# Patient Record
Sex: Male | Born: 1955 | Race: White | Hispanic: No | Smoking: Current every day smoker
Health system: Southern US, Community
[De-identification: ages and names within clinical notes are randomized; demographics above are authoritative.]

## PROBLEM LIST (undated history)

## (undated) DIAGNOSIS — I10 Essential (primary) hypertension: Secondary | ICD-10-CM

## (undated) DIAGNOSIS — I714 Abdominal aortic aneurysm, without rupture, unspecified: Secondary | ICD-10-CM

## (undated) DIAGNOSIS — E119 Type 2 diabetes mellitus without complications: Secondary | ICD-10-CM

## (undated) DIAGNOSIS — I2510 Atherosclerotic heart disease of native coronary artery without angina pectoris: Secondary | ICD-10-CM

## (undated) DIAGNOSIS — J439 Emphysema, unspecified: Secondary | ICD-10-CM

## (undated) DIAGNOSIS — Z720 Tobacco use: Secondary | ICD-10-CM

---

## 2006-01-25 ENCOUNTER — Emergency Department: Payer: Self-pay

## 2007-09-02 ENCOUNTER — Emergency Department: Payer: Self-pay | Admitting: Emergency Medicine

## 2007-09-18 ENCOUNTER — Ambulatory Visit: Payer: Self-pay | Admitting: Vascular Surgery

## 2007-12-18 ENCOUNTER — Ambulatory Visit: Payer: Self-pay | Admitting: Vascular Surgery

## 2013-05-25 ENCOUNTER — Ambulatory Visit: Payer: Self-pay | Admitting: Gastroenterology

## 2013-05-30 LAB — PATHOLOGY REPORT

## 2014-08-30 ENCOUNTER — Ambulatory Visit
Admission: RE | Admit: 2014-08-30 | Discharge: 2014-08-30 | Disposition: A | Discharge status: Home / Self Care | Payer: No Typology Code available for payment source | Source: Ambulatory Visit | Attending: Gastroenterology | Admitting: Gastroenterology

## 2014-08-30 ENCOUNTER — Ambulatory Visit: Payer: No Typology Code available for payment source | Admitting: Anesthesiology

## 2014-08-30 ENCOUNTER — Encounter
Admission: RE | Disposition: A | Discharge status: Home / Self Care | Payer: Self-pay | Source: Ambulatory Visit | Attending: Gastroenterology

## 2014-08-30 DIAGNOSIS — E119 Type 2 diabetes mellitus without complications: Secondary | ICD-10-CM | POA: Diagnosis not present

## 2014-08-30 DIAGNOSIS — Z86010 Personal history of colonic polyps: Secondary | ICD-10-CM | POA: Insufficient documentation

## 2014-08-30 DIAGNOSIS — K640 First degree hemorrhoids: Secondary | ICD-10-CM | POA: Diagnosis not present

## 2014-08-30 DIAGNOSIS — D123 Benign neoplasm of transverse colon: Secondary | ICD-10-CM | POA: Diagnosis not present

## 2014-08-30 DIAGNOSIS — K5730 Diverticulosis of large intestine without perforation or abscess without bleeding: Secondary | ICD-10-CM | POA: Insufficient documentation

## 2014-08-30 DIAGNOSIS — D125 Benign neoplasm of sigmoid colon: Secondary | ICD-10-CM | POA: Diagnosis not present

## 2014-08-30 HISTORY — DX: Type 2 diabetes mellitus without complications: E11.9

## 2014-08-30 HISTORY — PX: COLONOSCOPY WITH PROPOFOL: SHX5780

## 2014-08-30 LAB — GLUCOSE, CAPILLARY: Glucose-Capillary: 121 mg/dL — ABNORMAL HIGH (ref 65–99)

## 2014-08-30 SURGERY — COLONOSCOPY WITH PROPOFOL
Anesthesia: General

## 2014-08-30 MED ORDER — SODIUM CHLORIDE 0.9 % IV SOLN
INTRAVENOUS | Status: DC
  Administered 2014-08-30: 09:00:00 via INTRAVENOUS
  Administered 2014-08-30: 1000 mL via INTRAVENOUS

## 2014-08-30 MED ORDER — SODIUM CHLORIDE 0.9 % IV SOLN: INTRAVENOUS | Status: DC

## 2014-08-30 MED ORDER — PROPOFOL 10 MG/ML IV BOLUS
INTRAVENOUS | Status: DC | PRN
  Administered 2014-08-30 (×7): 20 mg via INTRAVENOUS

## 2014-08-30 MED ORDER — MIDAZOLAM HCL 2 MG/2ML IJ SOLN
INTRAMUSCULAR | Status: DC | PRN
  Administered 2014-08-30: 1 mg via INTRAVENOUS

## 2014-08-30 NOTE — H&P (Signed)
  Primary Care Physician:  Juluis Pitch, MD  Pre-Procedure History & Physical: HPI:  Eric Meadows is a 59 y.o. male is here for an colonoscopy.   Past Medical History  Diagnosis Date  . Diabetes mellitus without complication     No past surgical history on file.  Prior to Admission medications   Medication Sig Start Date End Date Taking? Authorizing Provider  metFORMIN (GLUCOPHAGE-XR) 750 MG 24 hr tablet Take 750 mg by mouth daily with breakfast.   Yes Historical Provider, MD  sildenafil (REVATIO) 20 MG tablet Take 20 mg by mouth 3 (three) times daily.   Yes Historical Provider, MD  vardenafil (LEVITRA) 20 MG tablet Take 20 mg by mouth daily as needed for erectile dysfunction.   Yes Historical Provider, MD    Allergies as of 06/06/2014  . (Not on File)    No family history on file.  History   Social History  . Marital Status: Married    Spouse Name: N/A  . Number of Children: N/A  . Years of Education: N/A   Occupational History  . Not on file.   Social History Main Topics  . Smoking status: Not on file  . Smokeless tobacco: Not on file  . Alcohol Use: Not on file  . Drug Use: Not on file  . Sexual Activity: Not on file   Other Topics Concern  . Not on file   Social History Narrative  . No narrative on file     Physical Exam: BP 127/80 mmHg  Pulse 74  Temp(Src) 97.4 F (36.3 C)  Resp 20  Ht 5\' 10"  (1.778 m)  Wt 79.606 kg (175 lb 8 oz)  BMI 25.18 kg/m2  SpO2 95% General:   Alert,  pleasant and cooperative in NAD Head:  Normocephalic and atraumatic. Neck:  Supple; no masses or thyromegaly. Lungs:  Clear throughout to auscultation.    Heart:  Regular rate and rhythm. Abdomen:  Soft, nontender and nondistended. Normal bowel sounds, without guarding, and without rebound.   Neurologic:  Alert and  oriented x4;  grossly normal neurologically.  Impression/Plan: JACKSTON OAXACA is here for an colonoscopy to be performed for polyp surveillance  Risks,  benefits, limitations, and alternatives regarding  colonoscopy have been reviewed with the patient.  Questions have been answered.  All parties agreeable.   Josefine Class, MD  08/30/2014, 8:59 AM

## 2014-08-30 NOTE — Discharge Instructions (Signed)

## 2014-08-30 NOTE — Anesthesia Postprocedure Evaluation (Signed)
  Anesthesia Post-op Note  Patient: Eric Meadows  Procedure(s) Performed: Procedure(s): COLONOSCOPY WITH PROPOFOL (N/A)  Anesthesia type:General  Patient location: PACU  Post pain: Pain level controlled  Post assessment: Post-op Vital signs reviewed, Patient's Cardiovascular Status Stable, Respiratory Function Stable, Patent Airway and No signs of Nausea or vomiting  Post vital signs: Reviewed and stable  Last Vitals:  Filed Vitals:   08/30/14 0817  BP: 127/80  Pulse: 74  Temp: 36.3 C  Resp: 20    Level of consciousness: awake, alert  and patient cooperative  Complications: No apparent anesthesia complications

## 2014-08-30 NOTE — Op Note (Signed)
[facility] Gastroenterology Patient Name: Eric Meadows Procedure Date: 08/30/2014 9:05 AM MRN: [id number] Account #: [id number] Date of Birth: 24-Aug-1955 Admit Type: Outpatient Age: 59 Room: [facility] ENDO ROOM 1 Gender: Male Note Status: Finalized Procedure:         Colonoscopy Indications:       High risk colon cancer surveillance: Personal history of                     adenoma (10 mm or greater in size), Last colonoscopy: 2015 Patient Profile:   This is a 59 year old male. Providers:         Gerrit Heck. Rayann Heman, MD Referring MD:      Youlanda Roys. Lovie Macadamia, MD (Referring MD) Medicines:         Propofol per Anesthesia Complications:     No immediate complications. Procedure:         Pre-Anesthesia Assessment:                    - Prior to the procedure, a History and Physical was                     performed, and patient medications, allergies and                     sensitivities were reviewed. The patient's tolerance of                     previous anesthesia was reviewed.                    After obtaining informed consent, the colonoscope was                     passed under direct vision. Throughout the procedure, the                     patient's blood pressure, pulse, and oxygen saturations                     were monitored continuously. The Olympus CF-Q160AL                     colonoscope (S#. [id number]) was introduced through the anus                     and advanced to the the cecum, identified by appendiceal                     orifice and ileocecal valve. The colonoscopy was performed                     without difficulty. The patient tolerated the procedure                     well. The quality of the bowel preparation was good. Findings:      The perianal exam findings include non-thrombosed external hemorrhoids.      Four sessile polyps were found in the transverse colon. The polyps were       2 to 5 mm in size. These polyps were removed with a cold snare.       Resection and retrieval were complete.      Three sessile polyps were found in the sigmoid colon. The polyps were 2  to 5 mm in size. These polyps were removed with a cold snare. Resection       and retrieval were complete.      Internal hemorrhoids were found during retroflexion. The hemorrhoids       were Grade I (internal hemorrhoids that do not prolapse).      - Tatoo in transvere colon with no residual polyp tissue or scar      The exam was otherwise without abnormality. Impression:        - Non-thrombosed external hemorrhoids found on perianal                     exam.                    - Four 2 to 5 mm polyps in the transverse colon. Resected                     and retrieved.                    - Three 2 to 5 mm polyps in the sigmoid colon. Resected                     and retrieved.                    - Internal hemorrhoids.                    - Tatoo in transvere colon with no residual polyp tissue                     or scar                    - The examination was otherwise normal. Recommendation:    - Observe patient in GI recovery unit.                    - High fiber diet.                    - Continue present medications.                    - Await pathology results.                    - Repeat colonoscopy for surveillance based on pathology                     results, likely in 3 years.                    - Return to referring physician.                    - The findings and recommendations were discussed with the                     patient.                    - The findings and recommendations were discussed with the                     patient's family. Procedure Code(s): --- Professional ---                    [phone],  Colonoscopy, flexible; with removal of tumor(s),                     polyp(s), or other lesion(s) by snare technique CPT copyright 2014 American [employer]. All rights reserved. The codes documented in this report are preliminary  and upon coder review may  be revised to meet current compliance requirements. Mellody Life, MD 08/30/2014 9:43:36 AM This report has been signed electronically. Number of Addenda: 0 Note Initiated On: 08/30/2014 9:05 AM Scope Withdrawal Time: 0 hours 17 minutes 8 seconds  Total Procedure Duration: 0 hours 25 minutes 27 seconds       [facility]

## 2014-08-30 NOTE — Anesthesia Preprocedure Evaluation (Signed)
Anesthesia Evaluation  Patient identified by MRN, date of birth, ID band Patient awake    Reviewed: Allergy & Precautions, H&P , NPO status , Patient's Chart, lab work & pertinent test results, reviewed documented beta blocker date and time   Airway Mallampati: II  TM Distance: >3 FB Neck ROM: full    Dental no notable dental hx.    Pulmonary neg pulmonary ROS,  breath sounds clear to auscultation  Pulmonary exam normal       Cardiovascular Exercise Tolerance: Good negative cardio ROS  Rhythm:regular Rate:Normal     Neuro/Psych negative neurological ROS  negative psych ROS   GI/Hepatic negative GI ROS, Neg liver ROS,   Endo/Other  negative endocrine ROSdiabetes  Renal/GU negative Renal ROS  negative genitourinary   Musculoskeletal   Abdominal   Peds  Hematology negative hematology ROS (+)   Anesthesia Other Findings   Reproductive/Obstetrics negative OB ROS                             Anesthesia Physical Anesthesia Plan  ASA: II  Anesthesia Plan: General   Post-op Pain Management:    Induction:   Airway Management Planned:   Additional Equipment:   Intra-op Plan:   Post-operative Plan:   Informed Consent: I have reviewed the patients History and Physical, chart, labs and discussed the procedure including the risks, benefits and alternatives for the proposed anesthesia with the patient or authorized representative who has indicated his/her understanding and acceptance.   Dental Advisory Given  Plan Discussed with: CRNA  Anesthesia Plan Comments:         Anesthesia Quick Evaluation

## 2014-08-30 NOTE — Transfer of Care (Signed)
Immediate Anesthesia Transfer of Care Note  Patient: Eric Meadows  Procedure(s) Performed: Procedure(s): COLONOSCOPY WITH PROPOFOL (N/A)  Patient Location: PACU and Endoscopy Unit  Anesthesia Type:General  Level of Consciousness: sedated  Airway & Oxygen Therapy: Patient Spontanous Breathing and Patient connected to nasal cannula oxygen  Post-op Assessment: Report given to RN and Post -op Vital signs reviewed and stable  Post vital signs: stable  Last Vitals:  Filed Vitals:   08/30/14 0817  BP: 127/80  Pulse: 74  Temp: 36.3 C  Resp: 20    Complications: No apparent anesthesia complications

## 2014-08-31 ENCOUNTER — Encounter: Payer: Self-pay | Admitting: Gastroenterology

## 2014-09-02 LAB — SURGICAL PATHOLOGY

## 2017-09-13 ENCOUNTER — Emergency Department: Payer: 59

## 2017-09-13 ENCOUNTER — Emergency Department
Admission: EM | Admit: 2017-09-13 | Discharge: 2017-09-13 | Disposition: A | Discharge status: Home / Self Care | Payer: 59 | Attending: Student in an Organized Health Care Education/Training Program | Admitting: Student in an Organized Health Care Education/Training Program

## 2017-09-13 DIAGNOSIS — E119 Type 2 diabetes mellitus without complications: Secondary | ICD-10-CM | POA: Diagnosis not present

## 2017-09-13 DIAGNOSIS — R945 Abnormal results of liver function studies: Secondary | ICD-10-CM

## 2017-09-13 DIAGNOSIS — Z7984 Long term (current) use of oral hypoglycemic drugs: Secondary | ICD-10-CM | POA: Diagnosis not present

## 2017-09-13 DIAGNOSIS — Z79899 Other long term (current) drug therapy: Secondary | ICD-10-CM | POA: Insufficient documentation

## 2017-09-13 DIAGNOSIS — K859 Acute pancreatitis without necrosis or infection, unspecified: Secondary | ICD-10-CM | POA: Diagnosis not present

## 2017-09-13 DIAGNOSIS — R7989 Other specified abnormal findings of blood chemistry: Secondary | ICD-10-CM

## 2017-09-13 LAB — COMPREHENSIVE METABOLIC PANEL
ALBUMIN: 4.3 g/dL (ref 3.5–5.0)
ALK PHOS: 71 U/L (ref 38–126)
ALT: 14 U/L (ref 0–44)
AST: 18 U/L (ref 15–41)
Anion gap: 9 (ref 5–15)
BUN: 22 mg/dL (ref 8–23)
CHLORIDE: 103 mmol/L (ref 98–111)
CO2: 27 mmol/L (ref 22–32)
CREATININE: 1.15 mg/dL (ref 0.61–1.24)
Calcium: 9.3 mg/dL (ref 8.9–10.3)
GFR calc non Af Amer: >60 mL/min (ref >60)
GLUCOSE: 138 mg/dL — AB (ref 70–99)
Potassium: 3.7 mmol/L (ref 3.5–5.1)
SODIUM: 139 mmol/L (ref 135–145)
Total Bilirubin: 1.3 mg/dL — ABNORMAL HIGH (ref 0.3–1.2)
Total Protein: 7.5 g/dL (ref 6.5–8.1)

## 2017-09-13 LAB — URINALYSIS, COMPLETE (UACMP) WITH MICROSCOPIC
Bacteria, UA: NONE SEEN
Glucose, UA: NEGATIVE mg/dL
KETONES UR: 80 mg/dL — AB
Leukocytes, UA: NEGATIVE
Nitrite: NEGATIVE
Protein, ur: 100 mg/dL — AB
Specific Gravity, Urine: 1.034 — ABNORMAL HIGH (ref 1.005–1.030)
pH: 5 (ref 5.0–8.0)

## 2017-09-13 LAB — CBC
HCT: 48.3 % (ref 40.0–52.0)
Hemoglobin: 16.8 g/dL (ref 13.0–18.0)
MCH: 32.1 pg (ref 26.0–34.0)
MCHC: 34.9 g/dL (ref 32.0–36.0)
MCV: 92 fL (ref 80.0–100.0)
Platelets: 273 10*3/uL (ref 150–440)
RBC: 5.25 MIL/uL (ref 4.40–5.90)
RDW: 12.4 % (ref 11.5–14.5)
WBC: 15.7 10*3/uL — ABNORMAL HIGH (ref 3.8–10.6)

## 2017-09-13 LAB — LIPASE, BLOOD: Lipase: 77 U/L — ABNORMAL HIGH (ref 11–51)

## 2017-09-13 MED ORDER — POLYETHYLENE GLYCOL 3350 17 G PO PACK: 17.0000 g | PACK | Freq: Every day | ORAL | 0 refills | Status: AC

## 2017-09-13 MED ORDER — IOPAMIDOL (ISOVUE-300) INJECTION 61%
100.0000 mL | Freq: Once | INTRAVENOUS | Status: AC | PRN
  Administered 2017-09-13: 100 mL via INTRAVENOUS

## 2017-09-13 MED ORDER — SODIUM CHLORIDE 0.9 % IV BOLUS
1000.0000 mL | Freq: Once | INTRAVENOUS | Status: AC
  Administered 2017-09-13: 1000 mL via INTRAVENOUS

## 2017-09-13 MED ORDER — METOCLOPRAMIDE HCL 10 MG PO TABS: 10.0000 mg | ORAL_TABLET | Freq: Four times a day (QID) | ORAL | 0 refills | Status: AC | PRN

## 2017-09-13 MED ORDER — ONDANSETRON HCL 4 MG/2ML IJ SOLN
4.0000 mg | Freq: Once | INTRAMUSCULAR | Status: AC
Start: 2017-09-13 — End: 2017-09-13
  Administered 2017-09-13: 4 mg via INTRAVENOUS
  Filled 2017-09-13: qty 2

## 2017-09-13 MED ORDER — DEXTROSE 5 % AND 0.45 % NACL IV BOLUS
1000.0000 mL | Freq: Once | INTRAVENOUS | Status: AC
  Administered 2017-09-13: 1000 mL via INTRAVENOUS
  Filled 2017-09-13: qty 1000

## 2017-09-13 MED ORDER — MORPHINE SULFATE (PF) 4 MG/ML IV SOLN
4.0000 mg | INTRAVENOUS | Status: DC | PRN
  Administered 2017-09-13: 4 mg via INTRAVENOUS

## 2017-09-13 MED ORDER — MORPHINE SULFATE (PF) 4 MG/ML IV SOLN
4.0000 mg | INTRAVENOUS | Status: DC | PRN
  Filled 2017-09-13: qty 1

## 2017-09-13 MED ORDER — HYDROCODONE-ACETAMINOPHEN 5-325 MG PO TABS: 1.0000 | ORAL_TABLET | ORAL | 0 refills | Status: AC | PRN

## 2017-09-13 NOTE — Discharge Instructions (Addendum)

## 2017-09-13 NOTE — ED Notes (Signed)
Patient denies increased pain or nausea with eating crackers. Ambulatory to restroom with steady gait.

## 2017-09-13 NOTE — ED Notes (Signed)
Patient given ice water per Dr. Quentin Cornwall

## 2017-09-13 NOTE — ED Triage Notes (Signed)
Pt states he has pain in his back and abd, bad 2/10 back 7/10.

## 2017-09-13 NOTE — ED Notes (Signed)
Patient given saltines for PO challenge. Will monitor for nausea and abdominal pain

## 2017-09-13 NOTE — ED Provider Notes (Signed)
[facility] Emergency Department Provider Note    First MD Initiated Contact with Patient 09/13/17 1513     (approximate)  I have reviewed the triage vital signs and the nursing notes.   HISTORY  Chief Complaint Abnormal Lab    HPI Eric Meadows is a 62 y.o. male her diabetes who presents the ER with chief complaint of several days of mid back pain as well as nausea vomiting some diarrhea.  States that similar members at work and also been sick with similar symptoms.  States is been having very dark urine flank pain.  Denies any history of kidney stones but has had kidney infections in the past.  Denies any recent surgeries.  No recent antibiotics.  The pain is mild to moderate in severity.   He is a smoker.   Past Medical History:  Diagnosis Date  . Diabetes mellitus without complication ([place])    No family history on file.  There are no active problems to display for this patient.     Prior to Admission medications   Medication Sig Start Date End Date Taking? Authorizing Provider  HYDROcodone-acetaminophen (NORCO) 5-325 MG tablet Take 1 tablet by mouth every 4 (four) hours as needed for moderate pain. 09/13/17   Merlyn Lot, MD  metFORMIN (GLUCOPHAGE-XR) 750 MG 24 hr tablet Take 750 mg by mouth daily with breakfast.    [provider]  metoCLOPramide (REGLAN) 10 MG tablet Take 1 tablet (10 mg total) by mouth every 6 (six) hours as needed for nausea. 09/13/17 09/13/18  Merlyn Lot, MD  polyethylene glycol [facility] / Floria Raveling) packet Take 17 g by mouth daily. Mix one tablespoon with 8oz of your favorite juice or water every day until you are having soft formed stools. Then start taking once daily if you didn't have a stool the day before. 09/13/17   Merlyn Lot, MD  sildenafil (REVATIO) 20 MG tablet Take 20 mg by mouth 3 (three) times daily.    [provider]  vardenafil (LEVITRA) 20 MG tablet Take 20 mg by mouth daily as  needed for erectile dysfunction.    [provider]    Allergies Patient has no known allergies.    Social History Social History   Tobacco Use  . Smoking status: Not on file  Substance Use Topics  . Alcohol use: Not on file  . Drug use: Not on file    Review of Systems Patient denies headaches, rhinorrhea, blurry vision, numbness, shortness of breath, chest pain, edema, cough, abdominal pain, nausea, vomiting, diarrhea, dysuria, fevers, rashes or hallucinations unless otherwise stated above in HPI. ____________________________________________   PHYSICAL EXAM:  VITAL SIGNS: Vitals:   09/13/17 1830 09/13/17 1942  BP: (!) 172/99 (!) 142/87  Pulse: 99 95  Resp: (!) 28 20  Temp:    SpO2: 93% 99%    Constitutional: Alert and oriented.  Having trouble sitting still in the ER. Eyes: Conjunctivae are normal.  Head: Atraumatic. Nose: No congestion/rhinnorhea. Mouth/Throat: Mucous membranes are moist.   Neck: No stridor. Painless ROM.  Cardiovascular: Normal rate, regular rhythm. Grossly normal heart sounds.  Good peripheral circulation. Respiratory: Normal respiratory effort.  No retractions. Lungs CTAB. Gastrointestinal: Soft and nontender. No distention. No abdominal bruits. bilateral CVA tenderness. Genitourinary: defererd Musculoskeletal: No lower extremity tenderness nor edema.  No joint effusions. Neurologic:  Normal speech and language. No gross focal neurologic deficits are appreciated. No facial droop Skin:  Skin is warm, dry and intact. No rash  noted. Psychiatric: Mood and affect are normal. Speech and behavior are normal.  ____________________________________________   LABS (all labs ordered are listed, but only abnormal results are displayed)  Results for orders placed or performed during the hospital encounter of 09/13/17 (from the past 24 hour(s))  Lipase, blood     Status: Abnormal   Collection Time: 09/13/17  3:15 PM  Result Value Ref Range    Lipase 77 (H) 11 - 51 U/L  Comprehensive metabolic panel     Status: Abnormal   Collection Time: 09/13/17  3:15 PM  Result Value Ref Range   Sodium 139 135 - 145 mmol/L   Potassium 3.7 3.5 - 5.1 mmol/L   Chloride 103 98 - 111 mmol/L   CO2 27 22 - 32 mmol/L   Glucose, Bld 138 (H) 70 - 99 mg/dL   BUN 22 8 - 23 mg/dL   Creatinine, Ser 1.15 0.61 - 1.24 mg/dL   Calcium 9.3 8.9 - 10.3 mg/dL   Total Protein 7.5 6.5 - 8.1 g/dL   Albumin 4.3 3.5 - 5.0 g/dL   AST 18 15 - 41 U/L   ALT 14 0 - 44 U/L   Alkaline Phosphatase 71 38 - 126 U/L   Total Bilirubin 1.3 (H) 0.3 - 1.2 mg/dL   GFR calc non Af Amer >60 >60 mL/min   GFR calc Af Amer >60 >60 mL/min   Anion gap 9 5 - 15  CBC     Status: Abnormal   Collection Time: 09/13/17  3:15 PM  Result Value Ref Range   WBC 15.7 (H) 3.8 - 10.6 K/uL   RBC 5.25 4.40 - 5.90 MIL/uL   Hemoglobin 16.8 13.0 - 18.0 g/dL   HCT 48.3 40.0 - 52.0 %   MCV 92.0 80.0 - 100.0 fL   MCH 32.1 26.0 - 34.0 pg   MCHC 34.9 32.0 - 36.0 g/dL   RDW 12.4 11.5 - 14.5 %   Platelets 273 150 - 440 K/uL  Urinalysis, Complete w Microscopic     Status: Abnormal   Collection Time: 09/13/17  4:40 PM  Result Value Ref Range   Color, Urine AMBER (A) YELLOW   APPearance CLEAR (A) CLEAR   Specific Gravity, Urine 1.034 (H) 1.005 - 1.030   pH 5.0 5.0 - 8.0   Glucose, UA NEGATIVE NEGATIVE mg/dL   Hgb urine dipstick MODERATE (A) NEGATIVE   Bilirubin Urine SMALL (A) NEGATIVE   Ketones, ur 80 (A) NEGATIVE mg/dL   Protein, ur 100 (A) NEGATIVE mg/dL   Nitrite NEGATIVE NEGATIVE   Leukocytes, UA NEGATIVE NEGATIVE   RBC / HPF 11-20 0 - 5 RBC/hpf   WBC, UA 0-5 0 - 5 WBC/hpf   Bacteria, UA NONE SEEN NONE SEEN   Squamous Epithelial / LPF 0-5 0 - 5   Mucus PRESENT    ____________________________________________  EKG My review and personal interpretation at Time: 16:40   Indication: back pain  Rate: 95  Rhythm: sinus Axis: normal Other: normal intervals, no  stemi ____________________________________________  RADIOLOGY  I personally reviewed all radiographic images ordered to evaluate for the above acute complaints and reviewed radiology reports and findings.  These findings were personally discussed with the patient.  Please see medical record for radiology report.  ____________________________________________   PROCEDURES  Procedure(s) performed:  Procedures    Critical Care performed: no ____________________________________________   INITIAL IMPRESSION / ASSESSMENT AND PLAN / ED COURSE  Pertinent labs & imaging results that were available during my care  of the patient were reviewed by me and considered in my medical decision making (see chart for details).   DDX: pancreatitis, dehydration, choleithiasis, cholecystitis, acs, stone, pyelo  Eric Meadows is a 62 y.o. who presents to the ED with symptoms as described above..  Patient is AFVSS in ED. Exam as above. Given current presentation have considered the above differential. The patient will be placed on continuous pulse oximetry and telemetry for monitoring.  Laboratory evaluation will be sent to evaluate for the above complaints.  Patient on pain age and risk factors order CT imaging to evaluate for the above differential.  Provide IV fluids as well as IV pain medication.  CT imaging does show evidence of pancreatitis which is uncomplicated without evidence of infection or necrosis.  Blood work is otherwise reassuring.  Urinalysis does show evidence of ketones and based on his history likely on the downsloping end of this clinical course.  Will p.o. challenge and reassess.  Clinical Course as of Sep 13 2020  Tue Sep 13, 2017  1953 Sinus reassuring.  Patient tolerating p.o.  At this point do believe he stable and appropriate for outpatient follow-up.   [PR]    Clinical Course User Index [PR] Merlyn Lot, MD     As part of my medical decision making, I reviewed the  following data within the [place] notes reviewed and incorporated, Labs reviewed, notes from prior ED visits.  ____________________________________________   FINAL CLINICAL IMPRESSION(S) / ED DIAGNOSES  Final diagnoses:  Elevated LFTs  Acute pancreatitis without infection or necrosis, unspecified pancreatitis type      NEW MEDICATIONS STARTED DURING THIS VISIT:  Discharge Medication List as of 09/13/2017  8:03 PM    START taking these medications   Details  HYDROcodone-acetaminophen (NORCO) 5-325 MG tablet Take 1 tablet by mouth every 4 (four) hours as needed for moderate pain., Starting Tue 09/13/2017, Print    metoCLOPramide (REGLAN) 10 MG tablet Take 1 tablet (10 mg total) by mouth every 6 (six) hours as needed for nausea., Starting Tue 09/13/2017, Until Wed 09/13/2018, Print    polyethylene glycol (MIRALAX / GLYCOLAX) packet Take 17 g by mouth daily. Mix one tablespoon with 8oz of your favorite juice or water every day until you are having soft formed stools. Then start taking once daily if you didn't have a stool the day before., Starting Tue 09/13/2017, Print         Note:  This document was prepared using Dragon voice recognition software and may include unintentional dictation errors.    Merlyn Lot, MD 09/13/17 2022

## 2017-09-13 NOTE — ED Triage Notes (Signed)
Pt presents today from PCP for abnormal labs. Pt states PCP told him that he has an elevated WBC and infection somewhere pt is very anxious in triage.

## 2017-09-14 ENCOUNTER — Telehealth: Payer: Self-pay | Admitting: Gastroenterology

## 2017-09-14 NOTE — Telephone Encounter (Signed)
Left vm for pt to call office and schedule Fu with Dr. Vanga °

## 2017-10-19 ENCOUNTER — Ambulatory Visit: Payer: No Typology Code available for payment source | Admitting: Gastroenterology

## 2017-10-19 ENCOUNTER — Other Ambulatory Visit: Payer: Self-pay

## 2017-10-19 DIAGNOSIS — R112 Nausea with vomiting, unspecified: Secondary | ICD-10-CM

## 2019-01-25 IMAGING — CT CT ABD-PELV W/ CM
2 of 5 series · 16 of 46 positions shown, 18 images · IV contrast (APPLIED)
Comparison: CT, 09/02/2007

CLINICAL DATA: Back pain as well as right and left lower quadrant
abdominal pain for 7 days. Elevated white blood cell count.

EXAM:
CT ABDOMEN AND PELVIS WITH CONTRAST
TECHNIQUE: Multidetector CT imaging of the abdomen and pelvis was performed
using the standard protocol following bolus administration of
intravenous contrast.
CONTRAST:  100mL C7HCI4-UQQ IOPAMIDOL (C7HCI4-UQQ) INJECTION 61%

[Series 2: axial st · axial · 0.75mm/px · z∈[-531,-96]mm · 13 of 97 slices shown, 15 images]
[im 5/97  soft-tissue]
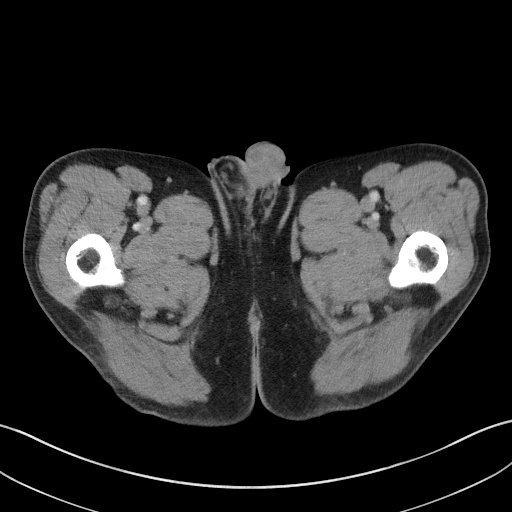
[im 5/97  bone]
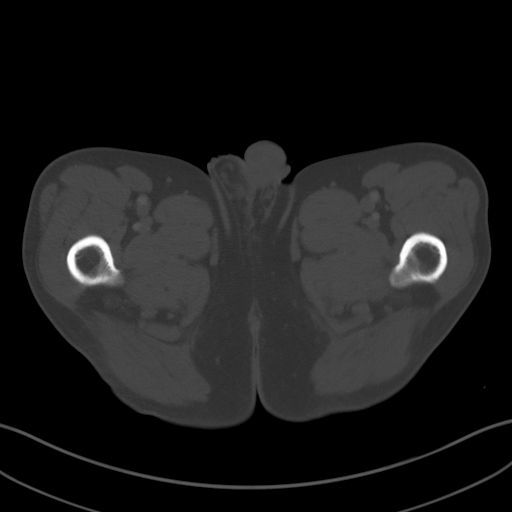
[im 15/97  soft-tissue]
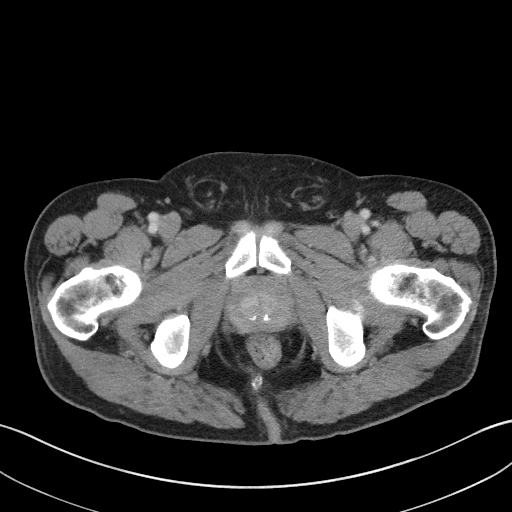
[im 20/97  soft-tissue]
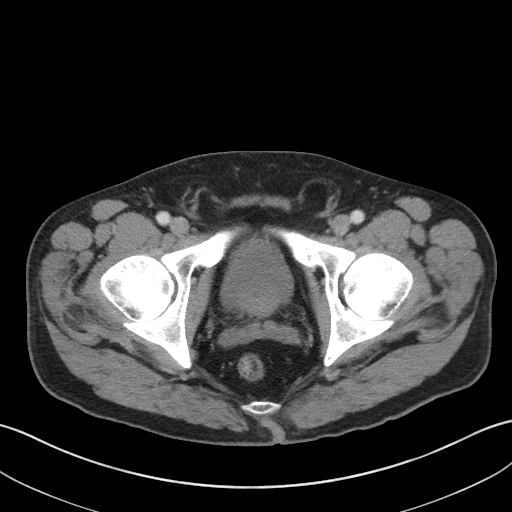
[im 29/97  soft-tissue]
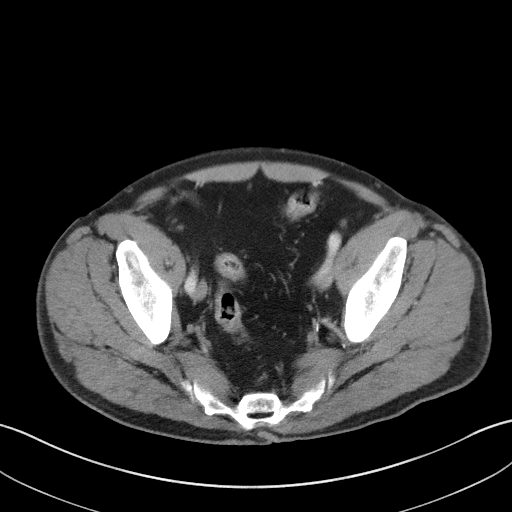
[im 34/97  soft-tissue]
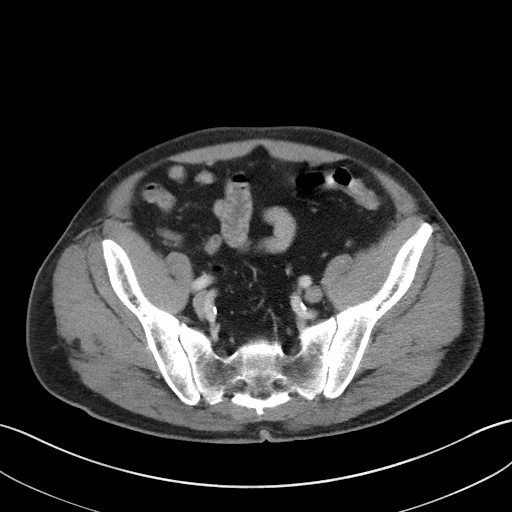
[im 44/97  soft-tissue]
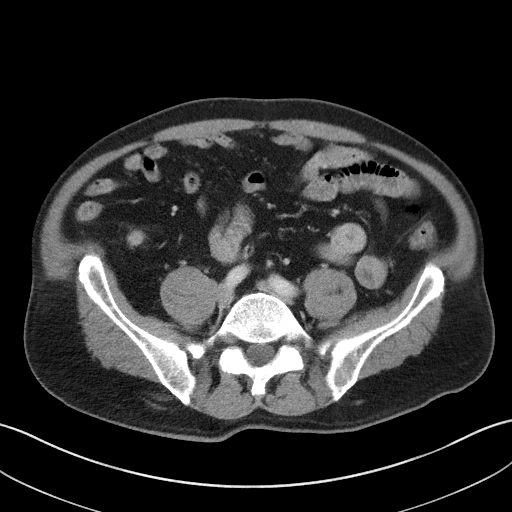
[im 49/97  soft-tissue]
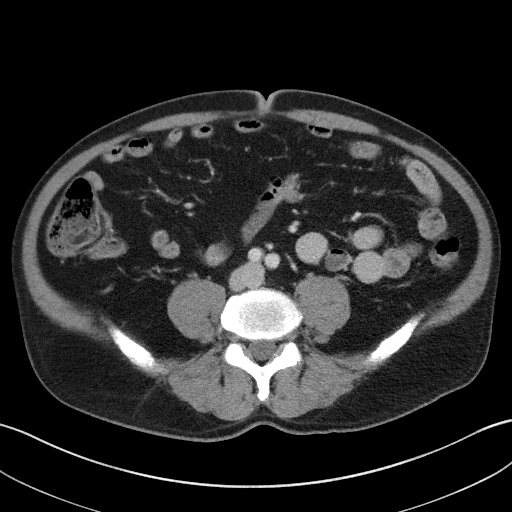
[im 53/97  soft-tissue]
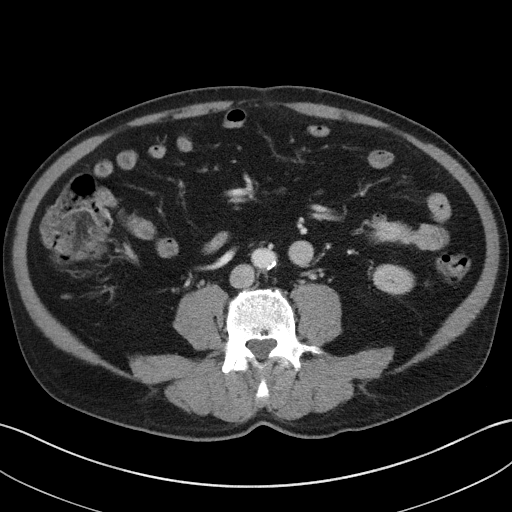
[im 63/97  soft-tissue]
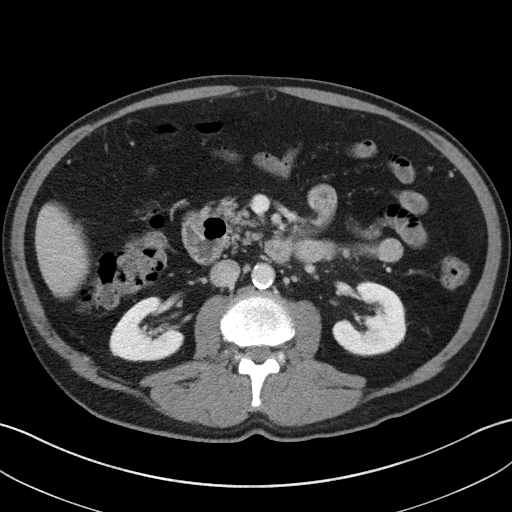
[im 63/97  bone]
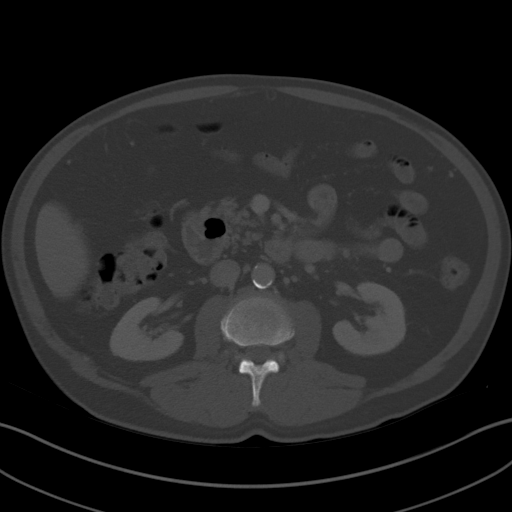
[im 68/97  soft-tissue]
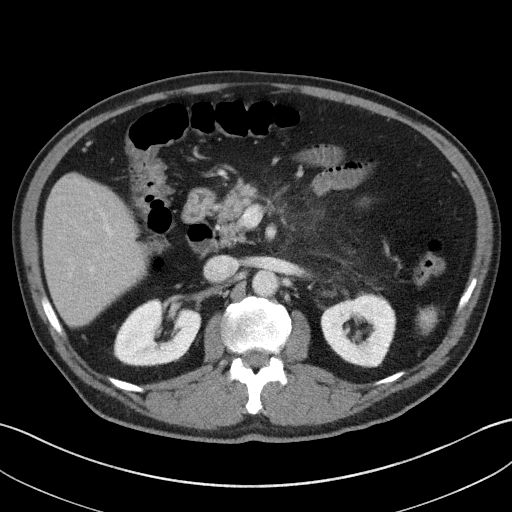
[im 77/97  soft-tissue]
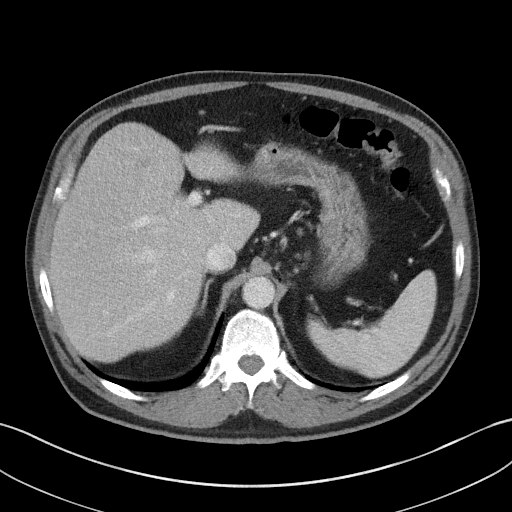
[im 82/97  soft-tissue]
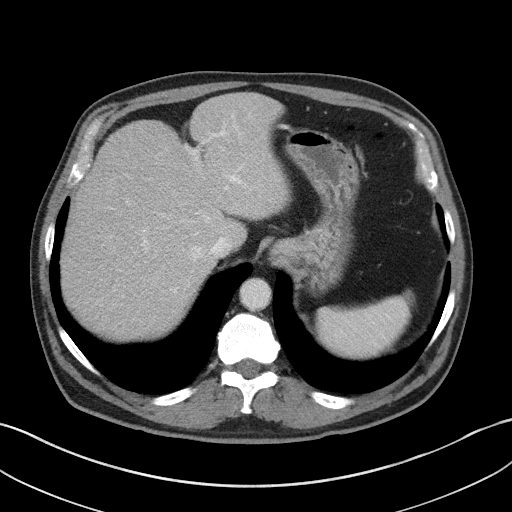
[im 92/97  soft-tissue]
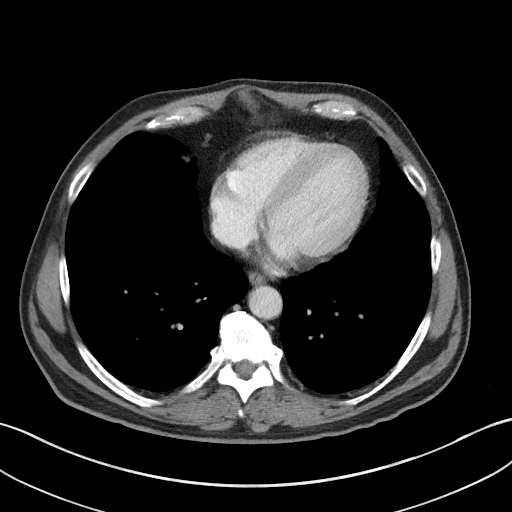

[Series 5: coronal st · coronal · 0.72mm/px · 3 of 90 slices shown]
[im 30/90  soft-tissue]
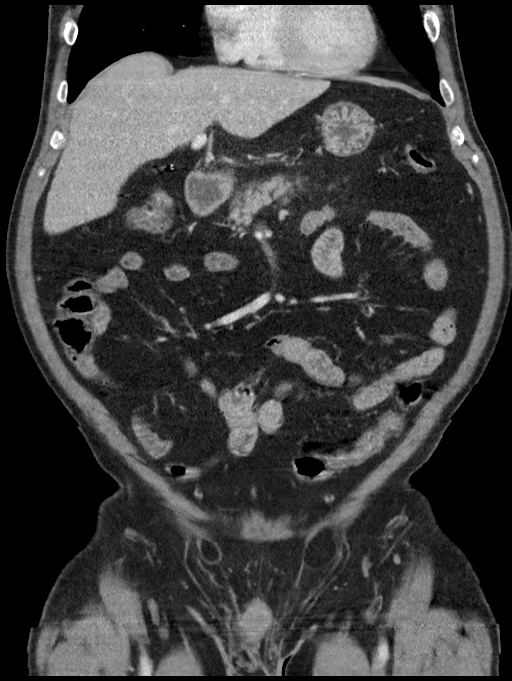
[im 40/90  soft-tissue]
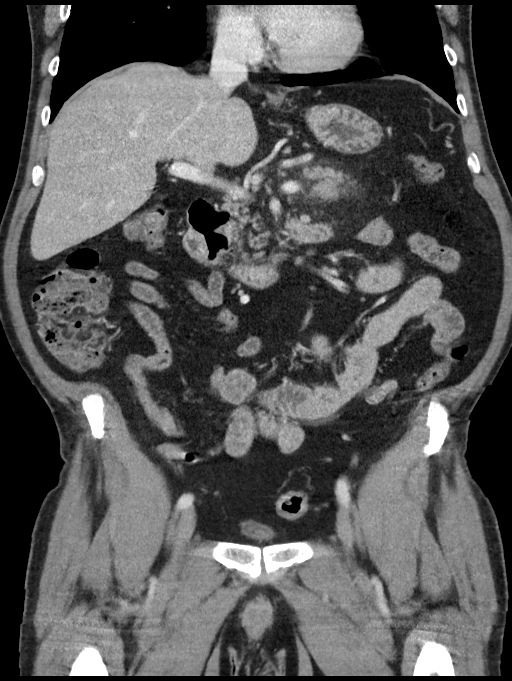
[im 50/90  soft-tissue]
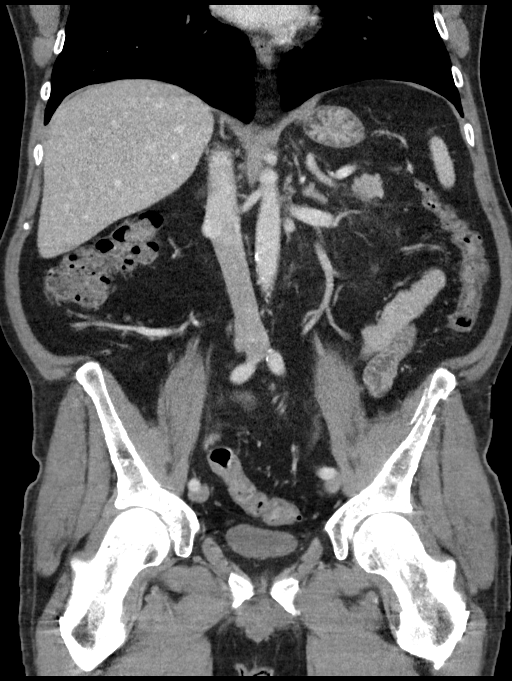

[16 of 46 positions shown; findings below may reference images not displayed]

FINDINGS: Lower chest: Clear lung bases.  The heart is normal in size.

Hepatobiliary: 5 mm low-density lesion at the anterior liver dome,
consistent with a cyst. No other liver masses or lesions. Liver
normal size. Status post cholecystectomy. No bile duct dilation.

Pancreas: There is hazy opacity adjacent to the pancreatic body and
tail consistent with inflammation, not present on the prior CT. No
pancreatic masses. No cysts.

Spleen: Normal in size without focal abnormality.

Adrenals/Urinary Tract: Adrenal glands are unremarkable. Kidneys are
normal, without renal calculi, focal lesion, or hydronephrosis.
Bladder is unremarkable.

Stomach/Bowel: Stomach is within normal limits. Appendix appears
normal. No evidence of bowel wall thickening, distention, or
inflammatory changes.

Vascular/Lymphatic: Aortic atherosclerosis. No enlarged abdominal or
pelvic lymph nodes.

Reproductive: Mildly enlarged prostate measuring 5.2 x 4.0 cm
transversely.

Other: No abdominal wall hernia or abnormality. No abdominopelvic
ascites.

Musculoskeletal: No fracture or acute finding. No osteoblastic or
osteolytic lesions.
IMPRESSION: 1. Mild, uncomplicated acute pancreatitis. No evidence of pancreatic
necrosis or of a pseudocyst. No venous thrombosis.
2. No other acute abnormality within the abdomen or pelvis.
3. Aortic atherosclerosis.

## 2021-04-13 DIAGNOSIS — Z86010 Personal history of colonic polyps: Secondary | ICD-10-CM | POA: Diagnosis not present

## 2021-08-14 DIAGNOSIS — S46112A Strain of muscle, fascia and tendon of long head of biceps, left arm, initial encounter: Secondary | ICD-10-CM | POA: Diagnosis not present

## 2021-08-14 DIAGNOSIS — M25512 Pain in left shoulder: Secondary | ICD-10-CM | POA: Diagnosis not present

## 2021-08-18 DIAGNOSIS — S46112A Strain of muscle, fascia and tendon of long head of biceps, left arm, initial encounter: Secondary | ICD-10-CM | POA: Diagnosis not present

## 2021-08-18 DIAGNOSIS — M25512 Pain in left shoulder: Secondary | ICD-10-CM | POA: Diagnosis not present

## 2021-08-25 DIAGNOSIS — M25512 Pain in left shoulder: Secondary | ICD-10-CM | POA: Diagnosis not present

## 2021-08-25 DIAGNOSIS — S46112A Strain of muscle, fascia and tendon of long head of biceps, left arm, initial encounter: Secondary | ICD-10-CM | POA: Diagnosis not present

## 2021-09-01 ENCOUNTER — Encounter: Payer: Self-pay | Admitting: Internal Medicine

## 2021-09-02 ENCOUNTER — Encounter: Payer: Self-pay | Admitting: Internal Medicine

## 2021-09-02 ENCOUNTER — Ambulatory Visit: Payer: Medicare HMO | Admitting: Certified Registered Nurse Anesthetist

## 2021-09-02 ENCOUNTER — Ambulatory Visit
Admission: RE | Admit: 2021-09-02 | Discharge: 2021-09-02 | Disposition: A | Discharge status: Home / Self Care | Payer: Medicare HMO | Attending: Internal Medicine | Admitting: Internal Medicine

## 2021-09-02 ENCOUNTER — Encounter
Admission: RE | Disposition: A | Discharge status: Home / Self Care | Payer: Self-pay | Source: Home / Self Care | Attending: Internal Medicine

## 2021-09-02 DIAGNOSIS — K5730 Diverticulosis of large intestine without perforation or abscess without bleeding: Secondary | ICD-10-CM | POA: Diagnosis not present

## 2021-09-02 DIAGNOSIS — K640 First degree hemorrhoids: Secondary | ICD-10-CM | POA: Diagnosis not present

## 2021-09-02 DIAGNOSIS — F17200 Nicotine dependence, unspecified, uncomplicated: Secondary | ICD-10-CM | POA: Diagnosis not present

## 2021-09-02 DIAGNOSIS — D124 Benign neoplasm of descending colon: Secondary | ICD-10-CM | POA: Insufficient documentation

## 2021-09-02 DIAGNOSIS — I10 Essential (primary) hypertension: Secondary | ICD-10-CM | POA: Insufficient documentation

## 2021-09-02 DIAGNOSIS — E119 Type 2 diabetes mellitus without complications: Secondary | ICD-10-CM | POA: Insufficient documentation

## 2021-09-02 DIAGNOSIS — K219 Gastro-esophageal reflux disease without esophagitis: Secondary | ICD-10-CM | POA: Insufficient documentation

## 2021-09-02 DIAGNOSIS — D120 Benign neoplasm of cecum: Secondary | ICD-10-CM | POA: Diagnosis not present

## 2021-09-02 DIAGNOSIS — Z86010 Personal history of colonic polyps: Secondary | ICD-10-CM | POA: Diagnosis not present

## 2021-09-02 DIAGNOSIS — N400 Enlarged prostate without lower urinary tract symptoms: Secondary | ICD-10-CM | POA: Diagnosis not present

## 2021-09-02 DIAGNOSIS — R69 Illness, unspecified: Secondary | ICD-10-CM | POA: Diagnosis not present

## 2021-09-02 DIAGNOSIS — J449 Chronic obstructive pulmonary disease, unspecified: Secondary | ICD-10-CM | POA: Diagnosis not present

## 2021-09-02 DIAGNOSIS — K635 Polyp of colon: Secondary | ICD-10-CM | POA: Diagnosis not present

## 2021-09-02 DIAGNOSIS — K648 Other hemorrhoids: Secondary | ICD-10-CM | POA: Diagnosis not present

## 2021-09-02 DIAGNOSIS — Z1211 Encounter for screening for malignant neoplasm of colon: Secondary | ICD-10-CM | POA: Insufficient documentation

## 2021-09-02 HISTORY — PX: COLONOSCOPY WITH PROPOFOL: SHX5780

## 2021-09-02 HISTORY — DX: Essential (primary) hypertension: I10

## 2021-09-02 LAB — GLUCOSE, CAPILLARY: Glucose-Capillary: 179 mg/dL — ABNORMAL HIGH (ref 70–99)

## 2021-09-02 SURGERY — COLONOSCOPY WITH PROPOFOL
Anesthesia: General

## 2021-09-02 MED ORDER — EPHEDRINE SULFATE (PRESSORS) 50 MG/ML IJ SOLN
INTRAMUSCULAR | Status: DC | PRN
  Administered 2021-09-02: 5 mg via INTRAVENOUS

## 2021-09-02 MED ORDER — PHENYLEPHRINE 80 MCG/ML (10ML) SYRINGE FOR IV PUSH (FOR BLOOD PRESSURE SUPPORT)
PREFILLED_SYRINGE | INTRAVENOUS | Status: DC | PRN
  Administered 2021-09-02 (×3): 80 ug via INTRAVENOUS

## 2021-09-02 MED ORDER — PROPOFOL 10 MG/ML IV BOLUS
INTRAVENOUS | Status: DC | PRN
  Administered 2021-09-02: 70 mg via INTRAVENOUS
  Administered 2021-09-02: 20 mg via INTRAVENOUS
  Administered 2021-09-02: 150 ug/kg/min via INTRAVENOUS

## 2021-09-02 MED ORDER — LIDOCAINE HCL (PF) 2 % IJ SOLN
INTRAMUSCULAR | Status: AC
  Filled 2021-09-02: qty 5

## 2021-09-02 MED ORDER — ONDANSETRON HCL 4 MG/2ML IJ SOLN
INTRAMUSCULAR | Status: AC
  Filled 2021-09-02: qty 2

## 2021-09-02 MED ORDER — SODIUM CHLORIDE 0.9 % IV SOLN
INTRAVENOUS | Status: DC
  Administered 2021-09-02: 1000 mL via INTRAVENOUS

## 2021-09-02 MED ORDER — LIDOCAINE HCL (CARDIAC) PF 100 MG/5ML IV SOSY
PREFILLED_SYRINGE | INTRAVENOUS | Status: DC | PRN
  Administered 2021-09-02: 50 mg via INTRAVENOUS

## 2021-09-02 MED ORDER — STERILE WATER FOR IRRIGATION IR SOLN
Status: DC | PRN
  Administered 2021-09-02: 60 mL

## 2021-09-02 MED ORDER — EPHEDRINE 5 MG/ML INJ
INTRAVENOUS | Status: AC
  Filled 2021-09-02: qty 5

## 2021-09-02 MED ORDER — PHENYLEPHRINE 80 MCG/ML (10ML) SYRINGE FOR IV PUSH (FOR BLOOD PRESSURE SUPPORT)
PREFILLED_SYRINGE | INTRAVENOUS | Status: AC
  Filled 2021-09-02: qty 10

## 2021-09-02 MED ORDER — PROPOFOL 1000 MG/100ML IV EMUL
INTRAVENOUS | Status: AC
  Filled 2021-09-02: qty 100

## 2021-09-02 NOTE — Interval H&P Note (Signed)
History and Physical Interval Note:  09/02/2021 8:20 AM  Eric Meadows  has presented today for surgery, with the diagnosis of HX OF COLON POLYPS.  The various methods of treatment have been discussed with the patient and family. After consideration of risks, benefits and other options for treatment, the patient has consented to  Procedure(s): COLONOSCOPY WITH PROPOFOL (N/A) as a surgical intervention.  The patient's history has been reviewed, patient examined, no change in status, stable for surgery.  I have reviewed the patient's chart and labs.  Questions were answered to the patient's satisfaction.     [place], [place]

## 2021-09-02 NOTE — Anesthesia Procedure Notes (Signed)
Procedure Name: MAC Date/Time: 09/02/2021 8:29 AM  Performed by: Tollie Eth, CRNAPre-anesthesia Checklist: Patient identified, Emergency Drugs available, Suction available and Patient being monitored Patient Re-evaluated:Patient Re-evaluated prior to induction Oxygen Delivery Method: Nasal cannula Induction Type: IV induction Placement Confirmation: positive ETCO2

## 2021-09-02 NOTE — Transfer of Care (Signed)
Immediate Anesthesia Transfer of Care Note  Patient: Eric Meadows  Procedure(s) Performed: COLONOSCOPY WITH PROPOFOL  Patient Location: Endoscopy Unit  Anesthesia Type:General  Level of Consciousness: drowsy  Airway & Oxygen Therapy: Patient Spontanous Breathing  Post-op Assessment: Report given to RN and Post -op Vital signs reviewed and stable  Post vital signs: Reviewed and stable  Last Vitals:  Vitals Value Taken Time  BP 94/67 09/02/21 0910  Temp    Pulse 88 09/02/21 0910  Resp 13 09/02/21 0910  SpO2 97 % 09/02/21 0910  Vitals shown include unvalidated device data.  Last Pain:  Vitals:   09/02/21 0753  TempSrc: Temporal  PainSc: 0-No pain         Complications: No notable events documented.

## 2021-09-02 NOTE — Anesthesia Preprocedure Evaluation (Signed)
Anesthesia Evaluation  Patient identified by MRN, date of birth, ID band Patient awake    Reviewed: Allergy & Precautions, NPO status , Patient's Chart, lab work & pertinent test results  History of Anesthesia Complications Negative for: history of anesthetic complications  Airway Mallampati: III  TM Distance: >3 FB Neck ROM: full    Dental  (+) Chipped, Poor Dentition   Pulmonary neg shortness of breath, COPD, Current Smoker,    Pulmonary exam normal        Cardiovascular Exercise Tolerance: Good hypertension, (-) anginaNormal cardiovascular exam     Neuro/Psych negative neurological ROS  negative psych ROS   GI/Hepatic negative GI ROS, Neg liver ROS, neg GERD  ,  Endo/Other  diabetes, Type 2  Renal/GU negative Renal ROS  negative genitourinary   Musculoskeletal   Abdominal   Peds  Hematology negative hematology ROS (+)   Anesthesia Other Findings Past Medical History: No date: Diabetes mellitus without complication (HCC) No date: Hypertension  Past Surgical History: 08/30/2014: COLONOSCOPY WITH PROPOFOL; N/A     Comment:  Procedure: COLONOSCOPY WITH PROPOFOL;  Surgeon: Josefine Class, MD;  Location: [facility] ENDOSCOPY;  Service:               Endoscopy;  Laterality: N/A;  BMI    Body Mass Index: 22.68 kg/m      Reproductive/Obstetrics negative OB ROS                             Anesthesia Physical Anesthesia Plan  ASA: 3  Anesthesia Plan: General   Post-op Pain Management:    Induction: Intravenous  PONV Risk Score and Plan: Propofol infusion and TIVA  Airway Management Planned: Natural Airway and Nasal Cannula  Additional Equipment:   Intra-op Plan:   Post-operative Plan:   Informed Consent: I have reviewed the patients History and Physical, chart, labs and discussed the procedure including the risks, benefits and alternatives for the proposed  anesthesia with the patient or authorized representative who has indicated his/her understanding and acceptance.     Dental Advisory Given  Plan Discussed with: Anesthesiologist, CRNA and Surgeon  Anesthesia Plan Comments: (Patient consented for risks of anesthesia including but not limited to:  - adverse reactions to medications - risk of airway placement if required - damage to eyes, teeth, lips or other oral mucosa - nerve damage due to positioning  - sore throat or hoarseness - Damage to heart, brain, nerves, lungs, other parts of body or loss of life  Patient voiced understanding.)        Anesthesia Quick Evaluation

## 2021-09-02 NOTE — Op Note (Signed)
[facility] Gastroenterology Patient Name: Eric Meadows Procedure Date: 09/02/2021 7:43 AM MRN: [id number] Account #: [id number] Date of Birth: 19-May-1955 Admit Type: Outpatient Age: 66 Room: [facility] ENDO ROOM 2 Gender: Male Note Status: Finalized Instrument Name: Jasper Riling [id number] Procedure:             Colonoscopy Indications:           High risk colon cancer surveillance: Personal history                         of multiple (3 or more) adenomas Providers:             Benay Pike. Alice Reichert MD, MD Referring MD:          Benay Pike. Alice Reichert MD, MD (Referring MD), No Local Md,                         MD (Referring MD) Medicines:             Propofol per Anesthesia Complications:         No immediate complications. Procedure:             Pre-Anesthesia Assessment:                        - The risks and benefits of the procedure and the                         sedation options and risks were discussed with the                         patient. All questions were answered and informed                         consent was obtained.                        - Patient identification and proposed procedure were                         verified prior to the procedure by the nurse. The                         procedure was verified in the procedure room.                        - ASA Grade Assessment: III - A patient with severe                         systemic disease.                        - After reviewing the risks and benefits, the patient                         was deemed in satisfactory condition to undergo the                         procedure.  After obtaining informed consent, the colonoscope was                         passed under direct vision. Throughout the procedure,                         the patient's blood pressure, pulse, and oxygen                         saturations were monitored continuously. The                         Colonoscope was  introduced through the anus and                         advanced to the the cecum, identified by appendiceal                         orifice and ileocecal valve. The colonoscopy was                         performed without difficulty. The patient tolerated                         the procedure well. The quality of the bowel                         preparation was adequate. The ileocecal valve,                         appendiceal orifice, and rectum were photographed. Findings:      The perianal examination was normal.      The digital rectal exam findings include enlarged prostate. Pertinent       negatives include normal sphincter tone.      A 11 mm polyp was found in the descending colon. The polyp was sessile.       The polyp was removed with a hot snare. Resection and retrieval were       complete.      A 6 mm polyp was found in the ileocecal valve. The polyp was sessile.       The polyp was removed with a jumbo cold forceps. Resection and retrieval       were complete. To prevent bleeding after the polypectomy, one hemostatic       clip was successfully placed (MR conditional). There was no bleeding at       the end of the procedure.      A 20 mm polyp was found in the ileocecal valve. The polyp was       carpet-like. The polyp was removed with a piecemeal technique using a       hot snare. Resection and retrieval were complete. Area was tattooed with       an injection of 2 mL of Spot (carbon black).      Three sessile polyps were found in the cecum. The polyps were 6 to 10 mm       in size. These polyps were removed with a hot snare. Resection and       retrieval were complete.      Non-bleeding internal hemorrhoids were found  during retroflexion. The       hemorrhoids were Grade I (internal hemorrhoids that do not prolapse).      A single small-mouthed diverticulum was found in the sigmoid colon.      The exam was otherwise without abnormality. Impression:            - Enlarged  prostate found on digital rectal exam.                        - One 11 mm polyp in the descending colon, removed                         with a hot snare. Resected and retrieved.                        - One 6 mm polyp at the ileocecal valve, removed with                         a jumbo cold forceps. Resected and retrieved. Clip (MR                         conditional) was placed.                        - One 20 mm polyp at the ileocecal valve, removed                         piecemeal using a hot snare. Resected and retrieved.                         Tattooed.                        - Three 6 to 10 mm polyps in the cecum, removed with a                         hot snare. Resected and retrieved.                        - Non-bleeding internal hemorrhoids.                        - Diverticulosis in the sigmoid colon.                        - The examination was otherwise normal. Recommendation:        - Patient has a contact number available for                         emergencies. The signs and symptoms of potential                         delayed complications were discussed with the patient.                         Return to normal activities tomorrow. Written                         discharge instructions were provided  to the patient.                        - Resume previous diet.                        - Continue present medications.                        - Repeat colonoscopy is recommended for surveillance.                         The colonoscopy date will be determined after                         pathology results from today's exam become available                         for review.                        - Return to GI office PRN.                        - The findings and recommendations were discussed with                         the patient. Procedure Code(s):     --- Professional ---                        [phone], Colonoscopy, flexible; with removal of                          tumor(s), polyp(s), or other lesion(s) by snare                         technique                        45380, 76, Colonoscopy, flexible; with biopsy, single                         or multiple                        45381, Colonoscopy, flexible; with directed submucosal                         injection(s), any substance Diagnosis Code(s):     --- Professional ---                        K57.30, Diverticulosis of large intestine without                         perforation or abscess without bleeding                        N40.0, Benign prostatic hyperplasia without lower                         urinary tract symptoms  K64.0, First degree hemorrhoids                        Z86.010, Personal history of colonic polyps                        K63.5, Polyp of colon CPT copyright 2019 American Medical Association. All rights reserved. The codes documented in this report are preliminary and upon coder review may  be revised to meet current compliance requirements. Efrain Sella MD, MD 09/02/2021 9:12:53 AM This report has been signed electronically. Number of Addenda: 0 Note Initiated On: 09/02/2021 7:43 AM Scope Withdrawal Time: 0 hours 24 minutes 42 seconds  Total Procedure Duration: 0 hours 29 minutes 23 seconds  Estimated Blood Loss:  Estimated blood loss was minimal. Estimated blood loss                         was minimal. Estimated blood loss was minimal.      [facility]

## 2021-09-02 NOTE — Anesthesia Postprocedure Evaluation (Signed)
Anesthesia Post Note  Patient: Eric Meadows  Procedure(s) Performed: COLONOSCOPY WITH PROPOFOL  Patient location during evaluation: Endoscopy Anesthesia Type: General Level of consciousness: awake and alert Pain management: pain level controlled Vital Signs Assessment: post-procedure vital signs reviewed and stable Respiratory status: spontaneous breathing, nonlabored ventilation, respiratory function stable and patient connected to nasal cannula oxygen Cardiovascular status: blood pressure returned to baseline and stable Postop Assessment: no apparent nausea or vomiting Anesthetic complications: no   No notable events documented.   Last Vitals:  Vitals:   09/02/21 0920 09/02/21 0930  BP: 105/74 122/80  Pulse: 79 84  Resp: 16 19  Temp:    SpO2: 96% 99%    Last Pain:  Vitals:   09/02/21 0930  TempSrc:   PainSc: 0-No pain                 Precious Haws Kyasia Steuck

## 2021-09-02 NOTE — H&P (Signed)
Outpatient short stay form Pre-procedure 09/02/2021 8:20 AM Eric Meadows K. Eric Meadows, M.D.  Primary Physician: Hendricks Milo, FNP  Reason for visit:  Personal history of adenomatous colon polyps (Colonoscopy 11/11/17).  History of present illness:                            Patient presents for colonoscopy for a personal hx of colon polyps. The patient denies abdominal pain, abnormal weight loss or rectal bleeding.      Current Facility-Administered Medications:    0.9 %  sodium chloride infusion, , Intravenous, Continuous, Eric Meadows, Eric Pike, MD, Last Rate: 20 mL/hr at 09/02/21 0809, 1,000 mL at 09/02/21 0809  Medications Prior to Admission  Medication Sig Dispense Refill Last Dose   HYDROcodone-acetaminophen (NORCO) 5-325 MG tablet Take 1 tablet by mouth every 4 (four) hours as needed for moderate pain. 6 tablet 0 09/01/2021   metFORMIN (GLUCOPHAGE-XR) 750 MG 24 hr tablet Take 750 mg by mouth daily with breakfast.   09/01/2021   polyethylene glycol (MIRALAX / GLYCOLAX) packet Take 17 g by mouth daily. Mix one tablespoon with 8oz of your favorite juice or water every day until you are having soft formed stools. Then start taking once daily if you didn't have a stool the day before. 30 each 0 09/01/2021   metoCLOPramide (REGLAN) 10 MG tablet Take 1 tablet (10 mg total) by mouth every 6 (six) hours as needed for nausea. 12 tablet 0    ondansetron (ZOFRAN-ODT) 4 MG disintegrating tablet     at prn   sildenafil (REVATIO) 20 MG tablet Take 20 mg by mouth 3 (three) times daily.    at prn   vardenafil (LEVITRA) 20 MG tablet Take 20 mg by mouth daily as needed for erectile dysfunction.    at prn     No Known Allergies   Past Medical History:  Diagnosis Date   Diabetes mellitus without complication ([place])    Hypertension     Review of systems:  Otherwise negative.    Physical Exam  Gen: Alert, oriented. Appears stated age.  HEENT: [place]/AT. PERRLA. Lungs: CTA, no wheezes. CV: RR nl S1, S2. Abd:  soft, benign, no masses. BS+ Ext: No edema. Pulses 2+    Planned procedures: Proceed with colonoscopy. The patient understands the nature of the planned procedure, indications, risks, alternatives and potential complications including but not limited to bleeding, infection, perforation, damage to internal organs and possible oversedation/side effects from anesthesia. The patient agrees and gives consent to proceed.  Please refer to procedure notes for findings, recommendations and patient disposition/instructions.     Eric Meadows K. Eric Meadows, M.D. Gastroenterology 09/02/2021  8:20 AM

## 2021-09-03 ENCOUNTER — Encounter: Payer: Self-pay | Admitting: Internal Medicine

## 2021-09-03 DIAGNOSIS — Z1389 Encounter for screening for other disorder: Secondary | ICD-10-CM | POA: Diagnosis not present

## 2021-09-03 DIAGNOSIS — Z0130 Encounter for examination of blood pressure without abnormal findings: Secondary | ICD-10-CM | POA: Diagnosis not present

## 2021-09-03 DIAGNOSIS — Z712 Person consulting for explanation of examination or test findings: Secondary | ICD-10-CM | POA: Diagnosis not present

## 2021-09-03 DIAGNOSIS — S46112A Strain of muscle, fascia and tendon of long head of biceps, left arm, initial encounter: Secondary | ICD-10-CM | POA: Diagnosis not present

## 2021-09-03 DIAGNOSIS — Z23 Encounter for immunization: Secondary | ICD-10-CM | POA: Diagnosis not present

## 2021-09-03 DIAGNOSIS — M25512 Pain in left shoulder: Secondary | ICD-10-CM | POA: Diagnosis not present

## 2021-09-03 DIAGNOSIS — I10 Essential (primary) hypertension: Secondary | ICD-10-CM | POA: Diagnosis not present

## 2021-09-03 DIAGNOSIS — E119 Type 2 diabetes mellitus without complications: Secondary | ICD-10-CM | POA: Diagnosis not present

## 2021-09-03 LAB — SURGICAL PATHOLOGY

## 2021-09-10 DIAGNOSIS — M25512 Pain in left shoulder: Secondary | ICD-10-CM | POA: Diagnosis not present

## 2021-09-10 DIAGNOSIS — S46112A Strain of muscle, fascia and tendon of long head of biceps, left arm, initial encounter: Secondary | ICD-10-CM | POA: Diagnosis not present

## 2021-09-17 DIAGNOSIS — M25512 Pain in left shoulder: Secondary | ICD-10-CM | POA: Diagnosis not present

## 2021-09-17 DIAGNOSIS — S46112A Strain of muscle, fascia and tendon of long head of biceps, left arm, initial encounter: Secondary | ICD-10-CM | POA: Diagnosis not present

## 2021-09-23 DIAGNOSIS — M25512 Pain in left shoulder: Secondary | ICD-10-CM | POA: Diagnosis not present

## 2021-09-23 DIAGNOSIS — S46112A Strain of muscle, fascia and tendon of long head of biceps, left arm, initial encounter: Secondary | ICD-10-CM | POA: Diagnosis not present

## 2021-09-30 DIAGNOSIS — M25512 Pain in left shoulder: Secondary | ICD-10-CM | POA: Diagnosis not present

## 2021-09-30 DIAGNOSIS — S46112A Strain of muscle, fascia and tendon of long head of biceps, left arm, initial encounter: Secondary | ICD-10-CM | POA: Diagnosis not present

## 2021-10-07 DIAGNOSIS — M25512 Pain in left shoulder: Secondary | ICD-10-CM | POA: Diagnosis not present

## 2021-10-07 DIAGNOSIS — S46112A Strain of muscle, fascia and tendon of long head of biceps, left arm, initial encounter: Secondary | ICD-10-CM | POA: Diagnosis not present

## 2021-10-13 DIAGNOSIS — S46112A Strain of muscle, fascia and tendon of long head of biceps, left arm, initial encounter: Secondary | ICD-10-CM | POA: Diagnosis not present

## 2021-10-13 DIAGNOSIS — M25512 Pain in left shoulder: Secondary | ICD-10-CM | POA: Diagnosis not present

## 2021-10-22 DIAGNOSIS — S46112A Strain of muscle, fascia and tendon of long head of biceps, left arm, initial encounter: Secondary | ICD-10-CM | POA: Diagnosis not present

## 2021-10-22 DIAGNOSIS — M25512 Pain in left shoulder: Secondary | ICD-10-CM | POA: Diagnosis not present

## 2021-10-26 DIAGNOSIS — Z1389 Encounter for screening for other disorder: Secondary | ICD-10-CM | POA: Diagnosis not present

## 2021-10-26 DIAGNOSIS — M25512 Pain in left shoulder: Secondary | ICD-10-CM | POA: Diagnosis not present

## 2021-10-26 DIAGNOSIS — Z712 Person consulting for explanation of examination or test findings: Secondary | ICD-10-CM | POA: Diagnosis not present

## 2021-10-26 DIAGNOSIS — Z0130 Encounter for examination of blood pressure without abnormal findings: Secondary | ICD-10-CM | POA: Diagnosis not present

## 2021-10-27 DIAGNOSIS — M19012 Primary osteoarthritis, left shoulder: Secondary | ICD-10-CM | POA: Diagnosis not present

## 2021-10-27 DIAGNOSIS — M25512 Pain in left shoulder: Secondary | ICD-10-CM | POA: Diagnosis not present

## 2021-10-29 DIAGNOSIS — E119 Type 2 diabetes mellitus without complications: Secondary | ICD-10-CM | POA: Diagnosis not present

## 2021-10-29 DIAGNOSIS — M7542 Impingement syndrome of left shoulder: Secondary | ICD-10-CM | POA: Diagnosis not present

## 2021-10-29 DIAGNOSIS — M19012 Primary osteoarthritis, left shoulder: Secondary | ICD-10-CM | POA: Diagnosis not present

## 2021-11-06 DIAGNOSIS — M25512 Pain in left shoulder: Secondary | ICD-10-CM | POA: Diagnosis not present

## 2021-11-11 DIAGNOSIS — M7542 Impingement syndrome of left shoulder: Secondary | ICD-10-CM | POA: Diagnosis not present

## 2021-11-11 DIAGNOSIS — M19012 Primary osteoarthritis, left shoulder: Secondary | ICD-10-CM | POA: Diagnosis not present

## 2021-11-11 DIAGNOSIS — E119 Type 2 diabetes mellitus without complications: Secondary | ICD-10-CM | POA: Diagnosis not present

## 2021-12-08 DIAGNOSIS — Z0000 Encounter for general adult medical examination without abnormal findings: Secondary | ICD-10-CM | POA: Diagnosis not present

## 2021-12-08 DIAGNOSIS — Z125 Encounter for screening for malignant neoplasm of prostate: Secondary | ICD-10-CM | POA: Diagnosis not present

## 2021-12-08 DIAGNOSIS — E119 Type 2 diabetes mellitus without complications: Secondary | ICD-10-CM | POA: Diagnosis not present

## 2021-12-08 DIAGNOSIS — Z712 Person consulting for explanation of examination or test findings: Secondary | ICD-10-CM | POA: Diagnosis not present

## 2021-12-08 DIAGNOSIS — R350 Frequency of micturition: Secondary | ICD-10-CM | POA: Diagnosis not present

## 2021-12-08 DIAGNOSIS — Z1389 Encounter for screening for other disorder: Secondary | ICD-10-CM | POA: Diagnosis not present

## 2021-12-08 DIAGNOSIS — Z0130 Encounter for examination of blood pressure without abnormal findings: Secondary | ICD-10-CM | POA: Diagnosis not present

## 2021-12-08 DIAGNOSIS — Z23 Encounter for immunization: Secondary | ICD-10-CM | POA: Diagnosis not present

## 2021-12-08 DIAGNOSIS — M25512 Pain in left shoulder: Secondary | ICD-10-CM | POA: Diagnosis not present

## 2021-12-08 DIAGNOSIS — R7309 Other abnormal glucose: Secondary | ICD-10-CM | POA: Diagnosis not present

## 2022-01-22 DIAGNOSIS — E119 Type 2 diabetes mellitus without complications: Secondary | ICD-10-CM | POA: Diagnosis not present

## 2022-05-03 DIAGNOSIS — R350 Frequency of micturition: Secondary | ICD-10-CM | POA: Diagnosis not present

## 2022-05-03 DIAGNOSIS — E119 Type 2 diabetes mellitus without complications: Secondary | ICD-10-CM | POA: Diagnosis not present

## 2022-05-03 DIAGNOSIS — Z712 Person consulting for explanation of examination or test findings: Secondary | ICD-10-CM | POA: Diagnosis not present

## 2022-05-03 DIAGNOSIS — Z0131 Encounter for examination of blood pressure with abnormal findings: Secondary | ICD-10-CM | POA: Diagnosis not present

## 2022-05-03 DIAGNOSIS — I10 Essential (primary) hypertension: Secondary | ICD-10-CM | POA: Diagnosis not present

## 2022-05-03 DIAGNOSIS — Z1389 Encounter for screening for other disorder: Secondary | ICD-10-CM | POA: Diagnosis not present

## 2022-06-02 DIAGNOSIS — F17210 Nicotine dependence, cigarettes, uncomplicated: Secondary | ICD-10-CM | POA: Diagnosis not present

## 2022-06-02 DIAGNOSIS — J432 Centrilobular emphysema: Secondary | ICD-10-CM | POA: Diagnosis not present

## 2022-06-02 DIAGNOSIS — Z136 Encounter for screening for cardiovascular disorders: Secondary | ICD-10-CM | POA: Diagnosis not present

## 2022-06-02 DIAGNOSIS — Z87891 Personal history of nicotine dependence: Secondary | ICD-10-CM | POA: Diagnosis not present

## 2022-06-02 DIAGNOSIS — I2510 Atherosclerotic heart disease of native coronary artery without angina pectoris: Secondary | ICD-10-CM | POA: Diagnosis not present

## 2022-06-02 DIAGNOSIS — Z122 Encounter for screening for malignant neoplasm of respiratory organs: Secondary | ICD-10-CM | POA: Diagnosis not present

## 2022-06-03 DIAGNOSIS — Z720 Tobacco use: Secondary | ICD-10-CM | POA: Diagnosis not present

## 2022-06-03 DIAGNOSIS — Z85118 Personal history of other malignant neoplasm of bronchus and lung: Secondary | ICD-10-CM | POA: Diagnosis not present

## 2022-06-17 DIAGNOSIS — Z0130 Encounter for examination of blood pressure without abnormal findings: Secondary | ICD-10-CM | POA: Diagnosis not present

## 2022-06-17 DIAGNOSIS — Z0131 Encounter for examination of blood pressure with abnormal findings: Secondary | ICD-10-CM | POA: Diagnosis not present

## 2022-06-17 DIAGNOSIS — Z23 Encounter for immunization: Secondary | ICD-10-CM | POA: Diagnosis not present

## 2022-06-17 DIAGNOSIS — Z712 Person consulting for explanation of examination or test findings: Secondary | ICD-10-CM | POA: Diagnosis not present

## 2022-06-17 DIAGNOSIS — Z1389 Encounter for screening for other disorder: Secondary | ICD-10-CM | POA: Diagnosis not present

## 2022-06-17 DIAGNOSIS — Z0000 Encounter for general adult medical examination without abnormal findings: Secondary | ICD-10-CM | POA: Diagnosis not present

## 2022-06-17 DIAGNOSIS — Z1331 Encounter for screening for depression: Secondary | ICD-10-CM | POA: Diagnosis not present

## 2022-09-06 DIAGNOSIS — I10 Essential (primary) hypertension: Secondary | ICD-10-CM | POA: Diagnosis not present

## 2022-09-06 DIAGNOSIS — E119 Type 2 diabetes mellitus without complications: Secondary | ICD-10-CM | POA: Diagnosis not present

## 2022-09-06 DIAGNOSIS — Z0130 Encounter for examination of blood pressure without abnormal findings: Secondary | ICD-10-CM | POA: Diagnosis not present

## 2022-09-06 DIAGNOSIS — F17200 Nicotine dependence, unspecified, uncomplicated: Secondary | ICD-10-CM | POA: Diagnosis not present

## 2022-09-06 DIAGNOSIS — Z1389 Encounter for screening for other disorder: Secondary | ICD-10-CM | POA: Diagnosis not present

## 2023-02-04 DIAGNOSIS — I2510 Atherosclerotic heart disease of native coronary artery without angina pectoris: Secondary | ICD-10-CM | POA: Diagnosis not present

## 2023-02-04 DIAGNOSIS — F17200 Nicotine dependence, unspecified, uncomplicated: Secondary | ICD-10-CM | POA: Diagnosis not present

## 2023-02-04 DIAGNOSIS — I10 Essential (primary) hypertension: Secondary | ICD-10-CM | POA: Diagnosis not present

## 2023-02-04 DIAGNOSIS — Z125 Encounter for screening for malignant neoplasm of prostate: Secondary | ICD-10-CM | POA: Diagnosis not present

## 2023-02-04 DIAGNOSIS — N400 Enlarged prostate without lower urinary tract symptoms: Secondary | ICD-10-CM | POA: Diagnosis not present

## 2023-02-04 DIAGNOSIS — E119 Type 2 diabetes mellitus without complications: Secondary | ICD-10-CM | POA: Diagnosis not present

## 2023-06-17 DIAGNOSIS — F17200 Nicotine dependence, unspecified, uncomplicated: Secondary | ICD-10-CM | POA: Diagnosis not present

## 2023-06-17 DIAGNOSIS — F17210 Nicotine dependence, cigarettes, uncomplicated: Secondary | ICD-10-CM | POA: Diagnosis not present

## 2023-06-17 DIAGNOSIS — F17213 Nicotine dependence, cigarettes, with withdrawal: Secondary | ICD-10-CM | POA: Diagnosis not present

## 2023-06-23 DIAGNOSIS — F17213 Nicotine dependence, cigarettes, with withdrawal: Secondary | ICD-10-CM | POA: Diagnosis not present

## 2023-06-23 DIAGNOSIS — F17200 Nicotine dependence, unspecified, uncomplicated: Secondary | ICD-10-CM | POA: Diagnosis not present

## 2023-07-28 DIAGNOSIS — I2510 Atherosclerotic heart disease of native coronary artery without angina pectoris: Secondary | ICD-10-CM | POA: Diagnosis not present

## 2023-07-28 DIAGNOSIS — E119 Type 2 diabetes mellitus without complications: Secondary | ICD-10-CM | POA: Diagnosis not present

## 2023-07-28 DIAGNOSIS — J439 Emphysema, unspecified: Secondary | ICD-10-CM | POA: Diagnosis not present

## 2023-10-03 DIAGNOSIS — D225 Melanocytic nevi of trunk: Secondary | ICD-10-CM | POA: Diagnosis not present

## 2023-10-03 DIAGNOSIS — C4401 Basal cell carcinoma of skin of lip: Secondary | ICD-10-CM | POA: Diagnosis not present

## 2023-10-03 DIAGNOSIS — L821 Other seborrheic keratosis: Secondary | ICD-10-CM | POA: Diagnosis not present

## 2023-10-03 DIAGNOSIS — D2272 Melanocytic nevi of left lower limb, including hip: Secondary | ICD-10-CM | POA: Diagnosis not present

## 2023-10-03 DIAGNOSIS — D2261 Melanocytic nevi of right upper limb, including shoulder: Secondary | ICD-10-CM | POA: Diagnosis not present

## 2023-10-03 DIAGNOSIS — C44319 Basal cell carcinoma of skin of other parts of face: Secondary | ICD-10-CM | POA: Diagnosis not present

## 2023-10-03 DIAGNOSIS — C44329 Squamous cell carcinoma of skin of other parts of face: Secondary | ICD-10-CM | POA: Diagnosis not present

## 2023-10-03 DIAGNOSIS — D485 Neoplasm of uncertain behavior of skin: Secondary | ICD-10-CM | POA: Diagnosis not present

## 2023-10-03 DIAGNOSIS — D2262 Melanocytic nevi of left upper limb, including shoulder: Secondary | ICD-10-CM | POA: Diagnosis not present

## 2023-10-03 DIAGNOSIS — D2271 Melanocytic nevi of right lower limb, including hip: Secondary | ICD-10-CM | POA: Diagnosis not present

## 2023-11-09 DIAGNOSIS — Z23 Encounter for immunization: Secondary | ICD-10-CM | POA: Diagnosis not present

## 2023-11-09 DIAGNOSIS — E119 Type 2 diabetes mellitus without complications: Secondary | ICD-10-CM | POA: Diagnosis not present

## 2023-11-14 DIAGNOSIS — C44329 Squamous cell carcinoma of skin of other parts of face: Secondary | ICD-10-CM | POA: Diagnosis not present

## 2023-11-23 ENCOUNTER — Encounter: Payer: Self-pay | Admitting: Internal Medicine

## 2023-11-30 ENCOUNTER — Ambulatory Visit: Payer: Self-pay

## 2023-11-30 ENCOUNTER — Encounter
Admission: RE | Disposition: A | Discharge status: Home / Self Care | Payer: Self-pay | Source: Home / Self Care | Attending: Internal Medicine

## 2023-11-30 ENCOUNTER — Ambulatory Visit
Admission: RE | Admit: 2023-11-30 | Discharge: 2023-11-30 | Disposition: A | Discharge status: Home / Self Care | Attending: Internal Medicine | Admitting: Internal Medicine

## 2023-11-30 ENCOUNTER — Encounter: Payer: Self-pay | Admitting: Internal Medicine

## 2023-11-30 DIAGNOSIS — K621 Rectal polyp: Secondary | ICD-10-CM | POA: Insufficient documentation

## 2023-11-30 DIAGNOSIS — Z860101 Personal history of adenomatous and serrated colon polyps: Secondary | ICD-10-CM | POA: Diagnosis not present

## 2023-11-30 DIAGNOSIS — Z1211 Encounter for screening for malignant neoplasm of colon: Secondary | ICD-10-CM | POA: Insufficient documentation

## 2023-11-30 DIAGNOSIS — I10 Essential (primary) hypertension: Secondary | ICD-10-CM | POA: Insufficient documentation

## 2023-11-30 DIAGNOSIS — J439 Emphysema, unspecified: Secondary | ICD-10-CM | POA: Insufficient documentation

## 2023-11-30 DIAGNOSIS — Z794 Long term (current) use of insulin: Secondary | ICD-10-CM | POA: Insufficient documentation

## 2023-11-30 DIAGNOSIS — D123 Benign neoplasm of transverse colon: Secondary | ICD-10-CM | POA: Diagnosis not present

## 2023-11-30 DIAGNOSIS — D122 Benign neoplasm of ascending colon: Secondary | ICD-10-CM | POA: Diagnosis not present

## 2023-11-30 DIAGNOSIS — J449 Chronic obstructive pulmonary disease, unspecified: Secondary | ICD-10-CM | POA: Diagnosis not present

## 2023-11-30 DIAGNOSIS — F17200 Nicotine dependence, unspecified, uncomplicated: Secondary | ICD-10-CM | POA: Diagnosis not present

## 2023-11-30 DIAGNOSIS — E109 Type 1 diabetes mellitus without complications: Secondary | ICD-10-CM | POA: Insufficient documentation

## 2023-11-30 DIAGNOSIS — K635 Polyp of colon: Secondary | ICD-10-CM | POA: Diagnosis not present

## 2023-11-30 DIAGNOSIS — D125 Benign neoplasm of sigmoid colon: Secondary | ICD-10-CM | POA: Diagnosis not present

## 2023-11-30 HISTORY — DX: Abdominal aortic aneurysm, without rupture, unspecified: I71.40

## 2023-11-30 HISTORY — DX: Tobacco use: Z72.0

## 2023-11-30 HISTORY — DX: Emphysema, unspecified: J43.9

## 2023-11-30 HISTORY — PX: POLYPECTOMY: SHX149

## 2023-11-30 HISTORY — DX: Atherosclerotic heart disease of native coronary artery without angina pectoris: I25.10

## 2023-11-30 HISTORY — PX: HEMOSTASIS CLIP PLACEMENT: SHX6857

## 2023-11-30 HISTORY — PX: COLONOSCOPY: SHX5424

## 2023-11-30 LAB — GLUCOSE, CAPILLARY: Glucose-Capillary: 151 mg/dL — ABNORMAL HIGH (ref 70–99)

## 2023-11-30 SURGERY — COLONOSCOPY
Anesthesia: General

## 2023-11-30 MED ORDER — PROPOFOL 500 MG/50ML IV EMUL
INTRAVENOUS | Status: DC | PRN
  Administered 2023-11-30: 150 ug/kg/min via INTRAVENOUS
  Administered 2023-11-30: 200 ug/kg/min via INTRAVENOUS
  Administered 2023-11-30: 70 mg via INTRAVENOUS

## 2023-11-30 MED ORDER — PHENYLEPHRINE 80 MCG/ML (10ML) SYRINGE FOR IV PUSH (FOR BLOOD PRESSURE SUPPORT)
PREFILLED_SYRINGE | INTRAVENOUS | Status: DC | PRN
  Administered 2023-11-30 (×2): 80 ug via INTRAVENOUS

## 2023-11-30 MED ORDER — SODIUM CHLORIDE 0.9 % IV SOLN
INTRAVENOUS | Status: DC
Start: 2023-11-30 — End: 2023-11-30

## 2023-11-30 MED ORDER — PROPOFOL 1000 MG/100ML IV EMUL
INTRAVENOUS | Status: AC
  Filled 2023-11-30: qty 100

## 2023-11-30 MED ORDER — PHENYLEPHRINE 80 MCG/ML (10ML) SYRINGE FOR IV PUSH (FOR BLOOD PRESSURE SUPPORT)
PREFILLED_SYRINGE | INTRAVENOUS | Status: AC
  Filled 2023-11-30: qty 10

## 2023-11-30 MED ORDER — EPHEDRINE SULFATE (PRESSORS) 25 MG/5ML IV SOSY
PREFILLED_SYRINGE | INTRAVENOUS | Status: DC | PRN
  Administered 2023-11-30: 5 mg via INTRAVENOUS

## 2023-11-30 NOTE — Anesthesia Postprocedure Evaluation (Signed)
 Anesthesia Post Note  Patient: Eric Meadows  Procedure(s) Performed: COLONOSCOPY POLYPECTOMY, INTESTINE CONTROL OF HEMORRHAGE, GI TRACT, ENDOSCOPIC, BY CLIPPING OR OVERSEWING  Patient location during evaluation: PACU Anesthesia Type: General Level of consciousness: awake and awake and alert Pain management: satisfactory to patient Vital Signs Assessment: post-procedure vital signs reviewed and stable Respiratory status: spontaneous breathing Cardiovascular status: stable Anesthetic complications: no   There were no known notable events for this encounter.   Last Vitals:  Vitals:   11/30/23 0921 11/30/23 0931  BP: 104/74 119/80  Pulse: 79 77  Resp: 16 17  Temp:    SpO2: 98% 98%    Last Pain:  Vitals:   11/30/23 0931  TempSrc:   PainSc: 0-No pain                 VAN STAVEREN,Cobain Morici

## 2023-11-30 NOTE — Transfer of Care (Signed)
 Immediate Anesthesia Transfer of Care Note  Patient: Eric Meadows  Procedure(s) Performed: COLONOSCOPY POLYPECTOMY, INTESTINE CONTROL OF HEMORRHAGE, GI TRACT, ENDOSCOPIC, BY CLIPPING OR OVERSEWING  Patient Location: Endoscopy Unit  Anesthesia Type:General  Level of Consciousness: awake and drowsy  Airway & Oxygen Therapy: Patient Spontanous Breathing  Post-op Assessment: Report given to RN and Post -op Vital signs reviewed and stable  Post vital signs: Reviewed and stable  Last Vitals:  Vitals Value Taken Time  BP 98/60 11/30/23 09:13  Temp 36.2 C 11/30/23 09:11  Pulse 78 11/30/23 09:13  Resp 23 11/30/23 09:13  SpO2 94 % 11/30/23 09:13    Last Pain:  Vitals:   11/30/23 0911  TempSrc: Temporal  PainSc: 0-No pain         Complications: There were no known notable events for this encounter.

## 2023-11-30 NOTE — Op Note (Signed)
 [facility] Gastroenterology Patient Name: Eric Meadows Procedure Date: 11/30/2023 8:19 AM MRN: [id number] Account #: [id number] Date of Birth: 20-Oct-1955 Admit Type: Outpatient Age: 68 Room: [facility] ENDO ROOM 2 Gender: Male Note Status: Finalized Instrument Name: Colon Scope [id number] Procedure:             Colonoscopy Indications:           High risk colon cancer surveillance: Personal history                         of multiple (3 or more) adenomas Providers:             Dhanya Bogle K. Aundria MD, MD Referring MD:          No Local Md, MD (Referring MD) Medicines:             Propofol  per Anesthesia Complications:         No immediate complications. Estimated blood loss:                         Minimal. Procedure:             Pre-Anesthesia Assessment:                        - The risks and benefits of the procedure and the                         sedation options and risks were discussed with the                         patient. All questions were answered and informed                         consent was obtained.                        - Patient identification and proposed procedure were                         verified prior to the procedure by the nurse. The                         procedure was verified in the procedure room.                        - ASA Grade Assessment: III - A patient with severe                         systemic disease.                        - After reviewing the risks and benefits, the patient                         was deemed in satisfactory condition to undergo the                         procedure.                        After  obtaining informed consent, the colonoscope was                         passed under direct vision. Throughout the procedure,                         the patient's blood pressure, pulse, and oxygen                         saturations were monitored continuously. The                         Colonoscope was introduced  through the anus and                         advanced to the the cecum, identified by appendiceal                         orifice and ileocecal valve. The colonoscopy was                         performed without difficulty. The patient tolerated                         the procedure well. The quality of the bowel                         preparation was good. The ileocecal valve, appendiceal                         orifice, and rectum were photographed. Findings:      The perianal and digital rectal examinations were normal. Pertinent       negatives include normal sphincter tone and no palpable rectal lesions.      A 14 mm polyp was found in the proximal ascending colon. The polyp was       sessile. The polyp was removed with a hot snare. Resection and retrieval       were complete. To prevent bleeding after the polypectomy, one hemostatic       clip was successfully placed (MR conditional). Clip manufacturer: [employer]. There was no bleeding during, or at the end, of the       procedure. Estimated blood loss: none.      A tattoo was seen at the ileocecal valve. The tattoo site appeared       normal.      A 12 mm polyp was found in the hepatic flexure. The polyp was sessile.       The polyp was removed with a hot snare. Resection and retrieval were       complete. To prevent bleeding after the polypectomy, one hemostatic clip       was successfully placed (MR conditional). Clip manufacturer: [employer]. There was no bleeding during, or at the end, of the       procedure. Estimated blood loss: none.      A 9 mm polyp was found in the proximal transverse colon. The polyp was       sessile. The polyp was removed with a hot snare. Resection and retrieval  were complete. Estimated blood loss: none.      Two sessile polyps were found in the splenic flexure. The polyps were 7       to 10 mm in size. These polyps were removed with a hot snare. Resection       and  retrieval were complete. Estimated blood loss: none.      A 9 mm polyp was found in the splenic flexure. The polyp was sessile.       The polyp was removed with a hot snare. Resection was complete, but the       polyp tissue was not retrieved. Estimated blood loss: none.      Four sessile polyps were found in the sigmoid colon. The polyps were 4       to 7 mm in size. These polyps were removed with a hot snare. Resection       and retrieval were complete. Estimated blood loss: none. These polyps       were removed with a cold snare. Resection and retrieval were complete.       Estimated blood loss was minimal.      Two sessile polyps were found in the rectum. The polyps were 3 to 5 mm       in size. These polyps were removed with a cold snare. Resection and       retrieval were complete. Estimated blood loss was minimal.      The exam was otherwise without abnormality on direct and retroflexion       views. Impression:            - One 14 mm polyp in the proximal ascending colon,                         removed with a hot snare. Resected and retrieved. Clip                         (MR conditional) was placed. Clip manufacturer: [employer].                        - A tattoo was seen at the ileocecal valve. The tattoo                         site appeared normal.                        - One 12 mm polyp at the hepatic flexure, removed with                         a hot snare. Resected and retrieved. Clip (MR                         conditional) was placed. Clip manufacturer: [employer].                        - One 9 mm polyp in the proximal transverse colon,  removed with a hot snare. Resected and retrieved.                        - Two 7 to 10 mm polyps at the splenic flexure,                         removed with a hot snare. Resected and retrieved.                        - One 9 mm polyp at the splenic  flexure, removed with                         a hot snare. Complete resection. Polyp tissue not                         retrieved.                        - Four 4 to 7 mm polyps in the sigmoid colon, removed                         with a hot snare and removed with a cold snare.                         Resected and retrieved.                        - The examination was otherwise normal on direct and                         retroflexion views. Recommendation:        - Patient has a contact number available for                         emergencies. The signs and symptoms of potential                         delayed complications were discussed with the patient.                         Return to normal activities tomorrow. Written                         discharge instructions were provided to the patient.                        - Resume previous diet.                        - Continue present medications.                        - Repeat colonoscopy in 1 year for surveillance.                        - Return to GI office PRN.                        - The findings and  recommendations were discussed with                         the patient. Procedure Code(s):     --- Professional ---                        [phone], Colonoscopy, flexible; with removal of                         tumor(s), polyp(s), or other lesion(s) by snare                         technique Diagnosis Code(s):     --- Professional ---                        D12.5, Benign neoplasm of sigmoid colon                        D12.3, Benign neoplasm of transverse colon (hepatic                         flexure or splenic flexure)                        D12.2, Benign neoplasm of ascending colon                        Z86.010, Personal history of colonic polyps CPT copyright 2022 American Medical Association. All rights reserved. The codes documented in this report are preliminary and upon coder review may  be revised to meet current  compliance requirements. Ladell MARLA Boss MD, MD 11/30/2023 9:15:11 AM This report has been signed electronically. Number of Addenda: 0 Note Initiated On: 11/30/2023 8:19 AM Scope Withdrawal Time: 0 hours 15 minutes 4 seconds  Total Procedure Duration: 0 hours 26 minutes 53 seconds  Estimated Blood Loss:  Estimated blood loss was minimal. Estimated blood                         loss: none.      [facility]

## 2023-11-30 NOTE — Anesthesia Preprocedure Evaluation (Addendum)
 Anesthesia Evaluation  Patient identified by MRN, date of birth, ID band Patient awake    Reviewed: Allergy & Precautions, NPO status , Patient's Chart, lab work & pertinent test results  Airway Mallampati: II  TM Distance: >3 FB Neck ROM: full    Dental  (+) Teeth Intact   Pulmonary neg pulmonary ROS, COPD, Current Smoker and Patient abstained from smoking.   Pulmonary exam normal  + decreased breath sounds      Cardiovascular Exercise Tolerance: Good hypertension, Pt. on medications negative cardio ROS Normal cardiovascular exam Rhythm:Regular Rate:Normal     Neuro/Psych negative neurological ROS  negative psych ROS   GI/Hepatic negative GI ROS, Neg liver ROS,,,  Endo/Other  negative endocrine ROSdiabetes, Well Controlled, Type 1, Insulin Dependent    Renal/GU negative Renal ROS  negative genitourinary   Musculoskeletal   Abdominal  (+) + obese  Peds negative pediatric ROS (+)  Hematology negative hematology ROS (+)   Anesthesia Other Findings Past Medical History: No date: AAA (abdominal aortic aneurysm) No date: Atherosclerosis of native coronary artery of native heart  without angina pectoris No date: Diabetes mellitus without complication (HCC) No date: Emphysema of lung (HCC) No date: Hypertension No date: Tobacco abuse  Past Surgical History: 08/30/2014: COLONOSCOPY WITH PROPOFOL ; N/A     Comment:  Procedure: COLONOSCOPY WITH PROPOFOL ;  Surgeon: Donnice Vaughn Manes, MD;  Location: [facility];  Service:               Endoscopy;  Laterality: N/A; 09/02/2021: COLONOSCOPY WITH PROPOFOL ; N/A     Comment:  Procedure: COLONOSCOPY WITH PROPOFOL ;  Surgeon: [place],               Ladell POUR, MD;  Location: [facility] ENDOSCOPY;  Service:               Gastroenterology;  Laterality: N/A;  BMI    Body Mass Index: 22.57 kg/m      Reproductive/Obstetrics negative OB ROS                               Anesthesia Physical Anesthesia Plan  ASA: 3  Anesthesia Plan: General   Post-op Pain Management:    Induction:   PONV Risk Score and Plan: Propofol  infusion and TIVA  Airway Management Planned: Natural Airway and Nasal Cannula  Additional Equipment:   Intra-op Plan:   Post-operative Plan:   Informed Consent: I have reviewed the patients History and Physical, chart, labs and discussed the procedure including the risks, benefits and alternatives for the proposed anesthesia with the patient or authorized representative who has indicated his/her understanding and acceptance.     Dental Advisory Given  Plan Discussed with: CRNA  Anesthesia Plan Comments:         Anesthesia Quick Evaluation

## 2023-12-01 DIAGNOSIS — C44319 Basal cell carcinoma of skin of other parts of face: Secondary | ICD-10-CM | POA: Diagnosis not present

## 2023-12-01 LAB — SURGICAL PATHOLOGY

## 2023-12-08 NOTE — Interval H&P Note (Signed)
 History and Physical Interval Note:  12/08/2023 11:35 AM  Eric Meadows  has presented today for surgery, with the diagnosis of History of adenomatous polyp of colon (Z86.0101).  The various methods of treatment have been discussed with the patient and family. After consideration of risks, benefits and other options for treatment, the patient has consented to  Procedure(s) with comments: COLONOSCOPY (N/A) - IDDM POLYPECTOMY, INTESTINE CONTROL OF HEMORRHAGE, GI TRACT, ENDOSCOPIC, BY CLIPPING OR OVERSEWING as a surgical intervention.  The patient's history has been reviewed, patient examined, no change in status, stable for surgery.  I have reviewed the patient's chart and labs.  Questions were answered to the patient's satisfaction.     [name], [name]

## 2023-12-08 NOTE — H&P (Signed)
 Outpatient short stay form Pre-procedure 12/08/2023 11:34 AM Eric Meadows, M.D.  Primary Physician: Therisa Cleveland, FNP  Reason for visit:  History of adenomatous colon polyps  History of present illness:  Date of Colon: 11/11/2017 Physician: Dr. IVAR Meadows @ [facility] Polyps Removed: Adenomatous Polyps THREE YEAR REPEAT RECOMMENDED BY DR. AUNDRIA. PH POLYPS 2014, 2015 DR. REIN.   Patient denies change in bowel habits, rectal bleeding, weight loss or abdominal pain.   No current facility-administered medications for this encounter.  Current Outpatient Medications:    metFORMIN (GLUCOPHAGE-XR) 750 MG 24 hr tablet, Take 750 mg by mouth daily with breakfast., Disp: , Rfl:    sildenafil (REVATIO) 20 MG tablet, Take 20 mg by mouth 3 (three) times daily., Disp: , Rfl:    vardenafil (LEVITRA) 20 MG tablet, Take 20 mg by mouth daily as needed for erectile dysfunction., Disp: , Rfl:    HYDROcodone -acetaminophen  (NORCO) 5-325 MG tablet, Take 1 tablet by mouth every 4 (four) hours as needed for moderate pain., Disp: 6 tablet, Rfl: 0   metoCLOPramide  (REGLAN ) 10 MG tablet, Take 1 tablet (10 mg total) by mouth every 6 (six) hours as needed for nausea., Disp: 12 tablet, Rfl: 0   ondansetron  (ZOFRAN -ODT) 4 MG disintegrating tablet, , Disp: , Rfl:    polyethylene glycol (MIRALAX  / GLYCOLAX ) packet, Take 17 g by mouth daily. Mix one tablespoon with 8oz of your favorite juice or water  every day until you are having soft formed stools. Then start taking once daily if you didn't have a stool the day before., Disp: 30 each, Rfl: 0  No medications prior to admission.     No Known Allergies   Past Medical History:  Diagnosis Date   AAA (abdominal aortic aneurysm)    Atherosclerosis of native coronary artery of native heart without angina pectoris    Diabetes mellitus without complication (HCC)    Emphysema of lung (HCC)    Hypertension    Tobacco abuse     Review of systems:  Otherwise negative.     Physical Exam  Gen: Alert, oriented. Appears stated age.  HEENT: [place]/AT. PERRLA. Lungs: CTA, no wheezes. CV: RR nl S1, S2. Abd: soft, benign, no masses. BS+ Ext: No edema. Pulses 2+    Planned procedures: Proceed with colonoscopy. The patient understands the nature of the planned procedure, indications, risks, alternatives and potential complications including but not limited to bleeding, infection, perforation, damage to internal organs and possible oversedation/side effects from anesthesia. The patient agrees and gives consent to proceed.  Please refer to procedure notes for findings, recommendations and patient disposition/instructions.     Eric Meadows, M.D. Gastroenterology 12/08/2023  11:34 AM

## 2024-01-09 DIAGNOSIS — C44319 Basal cell carcinoma of skin of other parts of face: Secondary | ICD-10-CM | POA: Diagnosis not present

## 2024-02-07 NOTE — Progress Notes (Signed)
 Eric Meadows                                          MRN: [id number]   02/07/2024   The [facility] Quality Team Specialist reviewed this patient medical record for the purposes of chart review for care gap closure. The following were reviewed: abstraction for care gap closure-glycemic status assessment.    [facility] Quality Team

## 2024-02-07 NOTE — Progress Notes (Signed)
 Eric Meadows                                          MRN: [id number]   02/07/2024   The [facility] Quality Team Specialist reviewed this patient medical record for the purposes of chart review for care gap closure. The following were reviewed: chart review for care gap closure-controlling blood pressure.    [facility] Quality Team

## 2024-02-24 NOTE — Progress Notes (Signed)
 WHITFIELD DULAY                                          MRN: [id number]   02/24/2024   The [facility] Quality Team Specialist reviewed this patient medical record for the purposes of chart review for care gap closure. The following were reviewed: chart review for care gap closure-kidney health evaluation for diabetes:eGFR  and uACR.    [facility] Quality Team

## 2024-03-01 ENCOUNTER — Encounter: Payer: Self-pay | Admitting: *Deleted

## 2024-03-01 NOTE — Progress Notes (Signed)
 Eric Meadows                                          MRN: [id number]   03/01/2024   The [facility] Quality Team Specialist reviewed this patient medical record for the purposes of chart review for care gap closure. The following were reviewed: chart review for care gap closure-diabetic eye exam.    [facility] Quality Team
